# Patient Record
Sex: Male | Born: 1935 | Race: White | Hispanic: No | Smoking: Never smoker
Health system: Southern US, Community
[De-identification: ages and names within clinical notes are randomized; demographics above are authoritative.]

## PROBLEM LIST (undated history)

## (undated) DIAGNOSIS — I219 Acute myocardial infarction, unspecified: Secondary | ICD-10-CM

## (undated) DIAGNOSIS — R079 Chest pain, unspecified: Secondary | ICD-10-CM

## (undated) DIAGNOSIS — C189 Malignant neoplasm of colon, unspecified: Secondary | ICD-10-CM

## (undated) DIAGNOSIS — I129 Hypertensive chronic kidney disease with stage 1 through stage 4 chronic kidney disease, or unspecified chronic kidney disease: Secondary | ICD-10-CM

## (undated) DIAGNOSIS — G309 Alzheimer's disease, unspecified: Secondary | ICD-10-CM

## (undated) DIAGNOSIS — N189 Chronic kidney disease, unspecified: Secondary | ICD-10-CM

## (undated) DIAGNOSIS — D72829 Elevated white blood cell count, unspecified: Secondary | ICD-10-CM

## (undated) DIAGNOSIS — E119 Type 2 diabetes mellitus without complications: Secondary | ICD-10-CM

## (undated) DIAGNOSIS — I4892 Unspecified atrial flutter: Secondary | ICD-10-CM

## (undated) DIAGNOSIS — I214 Non-ST elevation (NSTEMI) myocardial infarction: Secondary | ICD-10-CM

## (undated) DIAGNOSIS — G20 Parkinson's disease: Secondary | ICD-10-CM

## (undated) DIAGNOSIS — I2510 Atherosclerotic heart disease of native coronary artery without angina pectoris: Secondary | ICD-10-CM

## (undated) DIAGNOSIS — I10 Essential (primary) hypertension: Secondary | ICD-10-CM

## (undated) DIAGNOSIS — N183 Chronic kidney disease, stage 3 (moderate): Secondary | ICD-10-CM

## (undated) DIAGNOSIS — F0280 Dementia in other diseases classified elsewhere without behavioral disturbance: Secondary | ICD-10-CM

## (undated) DIAGNOSIS — W19XXXA Unspecified fall, initial encounter: Secondary | ICD-10-CM

## (undated) DIAGNOSIS — I951 Orthostatic hypotension: Secondary | ICD-10-CM

## (undated) DIAGNOSIS — E785 Hyperlipidemia, unspecified: Secondary | ICD-10-CM

## (undated) DIAGNOSIS — I609 Nontraumatic subarachnoid hemorrhage, unspecified: Secondary | ICD-10-CM

## (undated) DIAGNOSIS — R55 Syncope and collapse: Secondary | ICD-10-CM

## (undated) DIAGNOSIS — I200 Unstable angina: Secondary | ICD-10-CM

## (undated) HISTORY — DX: Atherosclerotic heart disease of native coronary artery without angina pectoris: I25.10

## (undated) HISTORY — DX: Elevated white blood cell count, unspecified: D72.829

## (undated) HISTORY — PX: COLONOSCOPY: SHX174

## (undated) HISTORY — PX: COLON RESECTION: SHX5231

## (undated) HISTORY — DX: Chronic kidney disease, stage 3 (moderate): N18.3

## (undated) HISTORY — DX: Alzheimer's disease, unspecified: G30.9

## (undated) HISTORY — DX: Chronic kidney disease, unspecified: N18.9

## (undated) HISTORY — DX: Parkinson's disease: G20

## (undated) HISTORY — PX: CORONARY ANGIOPLASTY WITH STENT PLACEMENT: SHX49

## (undated) HISTORY — DX: Nontraumatic subarachnoid hemorrhage, unspecified: I60.9

## (undated) HISTORY — PX: HAND SURGERY: SHX662

## (undated) HISTORY — DX: Unstable angina: I20.0

## (undated) HISTORY — DX: Type 2 diabetes mellitus without complications: E11.9

## (undated) HISTORY — DX: Malignant neoplasm of colon, unspecified: C18.9

## (undated) HISTORY — DX: Hypertensive chronic kidney disease with stage 1 through stage 4 chronic kidney disease, or unspecified chronic kidney disease: I12.9

## (undated) HISTORY — DX: Dementia in other diseases classified elsewhere without behavioral disturbance: F02.80

## (undated) HISTORY — DX: Orthostatic hypotension: I95.1

## (undated) HISTORY — DX: Unspecified fall, initial encounter: W19.XXXA

## (undated) HISTORY — DX: Unspecified atrial flutter: I48.92

## (undated) HISTORY — DX: Syncope and collapse: R55

## (undated) HISTORY — DX: Non-ST elevation (NSTEMI) myocardial infarction: I21.4

## (undated) HISTORY — DX: Hyperlipidemia, unspecified: E78.5

## (undated) HISTORY — DX: Chest pain, unspecified: R07.9

## (undated) HISTORY — DX: Acute myocardial infarction, unspecified: I21.9

---

## 2014-07-30 DIAGNOSIS — I219 Acute myocardial infarction, unspecified: Secondary | ICD-10-CM

## 2014-07-30 DIAGNOSIS — I214 Non-ST elevation (NSTEMI) myocardial infarction: Secondary | ICD-10-CM | POA: Insufficient documentation

## 2014-07-30 HISTORY — DX: Acute myocardial infarction, unspecified: I21.9

## 2015-04-25 DIAGNOSIS — N183 Chronic kidney disease, stage 3 unspecified: Secondary | ICD-10-CM

## 2015-04-25 DIAGNOSIS — I129 Hypertensive chronic kidney disease with stage 1 through stage 4 chronic kidney disease, or unspecified chronic kidney disease: Secondary | ICD-10-CM | POA: Insufficient documentation

## 2015-04-25 HISTORY — DX: Chronic kidney disease, stage 3 unspecified: N18.30

## 2015-04-25 HISTORY — DX: Hypertensive chronic kidney disease with stage 1 through stage 4 chronic kidney disease, or unspecified chronic kidney disease: I12.9

## 2015-05-29 DIAGNOSIS — R079 Chest pain, unspecified: Secondary | ICD-10-CM

## 2015-05-29 HISTORY — DX: Chest pain, unspecified: R07.9

## 2015-05-31 ENCOUNTER — Encounter (HOSPITAL_COMMUNITY): Payer: Self-pay | Admitting: *Deleted

## 2015-05-31 ENCOUNTER — Emergency Department (HOSPITAL_COMMUNITY): Payer: Medicare Other

## 2015-05-31 ENCOUNTER — Encounter (HOSPITAL_COMMUNITY)
Admission: EM | Disposition: A | Discharge status: Home / Self Care | Payer: Self-pay | Source: Home / Self Care | Attending: Cardiovascular Disease

## 2015-05-31 ENCOUNTER — Inpatient Hospital Stay (HOSPITAL_COMMUNITY)
Admission: EM | Admit: 2015-05-31 | Discharge: 2015-06-01 | DRG: 281 | Disposition: A | Discharge status: Home / Self Care | Payer: Medicare Other | Attending: Cardiovascular Disease | Admitting: Cardiovascular Disease

## 2015-05-31 ENCOUNTER — Ambulatory Visit (HOSPITAL_COMMUNITY): Payer: Medicare Other

## 2015-05-31 DIAGNOSIS — I453 Trifascicular block: Secondary | ICD-10-CM | POA: Diagnosis present

## 2015-05-31 DIAGNOSIS — I10 Essential (primary) hypertension: Secondary | ICD-10-CM

## 2015-05-31 DIAGNOSIS — E1122 Type 2 diabetes mellitus with diabetic chronic kidney disease: Secondary | ICD-10-CM | POA: Diagnosis present

## 2015-05-31 DIAGNOSIS — G20 Parkinson's disease: Secondary | ICD-10-CM

## 2015-05-31 DIAGNOSIS — Z7982 Long term (current) use of aspirin: Secondary | ICD-10-CM

## 2015-05-31 DIAGNOSIS — I2510 Atherosclerotic heart disease of native coronary artery without angina pectoris: Secondary | ICD-10-CM | POA: Diagnosis present

## 2015-05-31 DIAGNOSIS — Z7902 Long term (current) use of antithrombotics/antiplatelets: Secondary | ICD-10-CM | POA: Diagnosis not present

## 2015-05-31 DIAGNOSIS — E785 Hyperlipidemia, unspecified: Secondary | ICD-10-CM

## 2015-05-31 DIAGNOSIS — E876 Hypokalemia: Secondary | ICD-10-CM | POA: Diagnosis not present

## 2015-05-31 DIAGNOSIS — R079 Chest pain, unspecified: Secondary | ICD-10-CM | POA: Diagnosis not present

## 2015-05-31 DIAGNOSIS — I200 Unstable angina: Secondary | ICD-10-CM

## 2015-05-31 DIAGNOSIS — I252 Old myocardial infarction: Secondary | ICD-10-CM | POA: Diagnosis not present

## 2015-05-31 DIAGNOSIS — I129 Hypertensive chronic kidney disease with stage 1 through stage 4 chronic kidney disease, or unspecified chronic kidney disease: Secondary | ICD-10-CM | POA: Diagnosis present

## 2015-05-31 DIAGNOSIS — E118 Type 2 diabetes mellitus with unspecified complications: Secondary | ICD-10-CM

## 2015-05-31 DIAGNOSIS — G20A1 Parkinson's disease without dyskinesia, without mention of fluctuations: Secondary | ICD-10-CM

## 2015-05-31 DIAGNOSIS — N189 Chronic kidney disease, unspecified: Secondary | ICD-10-CM

## 2015-05-31 DIAGNOSIS — E119 Type 2 diabetes mellitus without complications: Secondary | ICD-10-CM

## 2015-05-31 DIAGNOSIS — I214 Non-ST elevation (NSTEMI) myocardial infarction: Principal | ICD-10-CM | POA: Diagnosis present

## 2015-05-31 DIAGNOSIS — I25110 Atherosclerotic heart disease of native coronary artery with unstable angina pectoris: Secondary | ICD-10-CM | POA: Diagnosis present

## 2015-05-31 DIAGNOSIS — Z955 Presence of coronary angioplasty implant and graft: Secondary | ICD-10-CM

## 2015-05-31 HISTORY — PX: CARDIAC CATHETERIZATION: SHX172

## 2015-05-31 HISTORY — DX: Chronic kidney disease, unspecified: N18.9

## 2015-05-31 HISTORY — DX: Essential (primary) hypertension: I10

## 2015-05-31 HISTORY — DX: Type 2 diabetes mellitus without complications: E11.9

## 2015-05-31 HISTORY — DX: Unstable angina: I20.0

## 2015-05-31 HISTORY — DX: Hyperlipidemia, unspecified: E78.5

## 2015-05-31 HISTORY — DX: Parkinson's disease: G20

## 2015-05-31 HISTORY — DX: Non-ST elevation (NSTEMI) myocardial infarction: I21.4

## 2015-05-31 LAB — BASIC METABOLIC PANEL
ANION GAP: 9 (ref 5–15)
BUN: 13 mg/dL (ref 6–20)
CALCIUM: 9.1 mg/dL (ref 8.9–10.3)
CHLORIDE: 105 mmol/L (ref 101–111)
CO2: 27 mmol/L (ref 22–32)
Creatinine, Ser: 1.33 mg/dL — ABNORMAL HIGH (ref 0.61–1.24)
GFR calc non Af Amer: 49 mL/min — ABNORMAL LOW (ref >60)
GFR, EST AFRICAN AMERICAN: 57 mL/min — AB (ref >60)
Glucose, Bld: 112 mg/dL — ABNORMAL HIGH (ref 65–99)
Potassium: 3.5 mmol/L (ref 3.5–5.1)
SODIUM: 141 mmol/L (ref 135–145)

## 2015-05-31 LAB — CBC
HCT: 36.2 % — ABNORMAL LOW (ref 39.0–52.0)
Hemoglobin: 12.6 g/dL — ABNORMAL LOW (ref 13.0–17.0)
MCH: 31.3 pg (ref 26.0–34.0)
MCHC: 34.8 g/dL (ref 30.0–36.0)
MCV: 89.8 fL (ref 78.0–100.0)
PLATELETS: 182 10*3/uL (ref 150–400)
RBC: 4.03 MIL/uL — AB (ref 4.22–5.81)
RDW: 12.9 % (ref 11.5–15.5)
WBC: 8 10*3/uL (ref 4.0–10.5)

## 2015-05-31 LAB — COMPREHENSIVE METABOLIC PANEL
ALK PHOS: 53 U/L (ref 38–126)
ALT: 14 U/L — ABNORMAL LOW (ref 17–63)
ANION GAP: 10 (ref 5–15)
AST: 26 U/L (ref 15–41)
Albumin: 3.7 g/dL (ref 3.5–5.0)
BUN: 14 mg/dL (ref 6–20)
CALCIUM: 9.3 mg/dL (ref 8.9–10.3)
CHLORIDE: 101 mmol/L (ref 101–111)
CO2: 27 mmol/L (ref 22–32)
Creatinine, Ser: 1.42 mg/dL — ABNORMAL HIGH (ref 0.61–1.24)
GFR calc non Af Amer: 45 mL/min — ABNORMAL LOW (ref >60)
GFR, EST AFRICAN AMERICAN: 53 mL/min — AB (ref >60)
Glucose, Bld: 122 mg/dL — ABNORMAL HIGH (ref 65–99)
Potassium: 3.5 mmol/L (ref 3.5–5.1)
SODIUM: 138 mmol/L (ref 135–145)
Total Bilirubin: 0.4 mg/dL (ref 0.3–1.2)
Total Protein: 6.6 g/dL (ref 6.5–8.1)

## 2015-05-31 LAB — CBC WITH DIFFERENTIAL/PLATELET
BASOS PCT: 1 %
Basophils Absolute: 0.1 10*3/uL (ref 0.0–0.1)
Eosinophils Absolute: 0.4 10*3/uL (ref 0.0–0.7)
Eosinophils Relative: 4 %
HEMATOCRIT: 37.6 % — AB (ref 39.0–52.0)
HEMOGLOBIN: 12.4 g/dL — AB (ref 13.0–17.0)
Lymphocytes Relative: 27 %
Lymphs Abs: 2.6 10*3/uL (ref 0.7–4.0)
MCH: 29.9 pg (ref 26.0–34.0)
MCHC: 33 g/dL (ref 30.0–36.0)
MCV: 90.6 fL (ref 78.0–100.0)
MONOS PCT: 11 %
Monocytes Absolute: 1 10*3/uL (ref 0.1–1.0)
NEUTROS ABS: 5.6 10*3/uL (ref 1.7–7.7)
NEUTROS PCT: 57 %
Platelets: 200 10*3/uL (ref 150–400)
RBC: 4.15 MIL/uL — AB (ref 4.22–5.81)
RDW: 12.9 % (ref 11.5–15.5)
WBC: 9.7 10*3/uL (ref 4.0–10.5)

## 2015-05-31 LAB — LIPID PANEL
CHOLESTEROL: 198 mg/dL (ref 0–200)
HDL: 38 mg/dL — AB (ref >40)
LDL Cholesterol: 133 mg/dL — ABNORMAL HIGH (ref 0–99)
TRIGLYCERIDES: 136 mg/dL (ref <150)
Total CHOL/HDL Ratio: 5.2 RATIO
VLDL: 27 mg/dL (ref 0–40)

## 2015-05-31 LAB — GLUCOSE, CAPILLARY
GLUCOSE-CAPILLARY: 146 mg/dL — AB (ref 65–99)
Glucose-Capillary: 105 mg/dL — ABNORMAL HIGH (ref 65–99)
Glucose-Capillary: 108 mg/dL — ABNORMAL HIGH (ref 65–99)
Glucose-Capillary: 96 mg/dL (ref 65–99)

## 2015-05-31 LAB — MRSA PCR SCREENING: MRSA by PCR: NEGATIVE

## 2015-05-31 LAB — CREATININE, SERUM
CREATININE: 1.34 mg/dL — AB (ref 0.61–1.24)
GFR, EST AFRICAN AMERICAN: 56 mL/min — AB (ref >60)
GFR, EST NON AFRICAN AMERICAN: 49 mL/min — AB (ref >60)

## 2015-05-31 LAB — BRAIN NATRIURETIC PEPTIDE: B Natriuretic Peptide: 142 pg/mL — ABNORMAL HIGH (ref 0.0–100.0)

## 2015-05-31 LAB — PROTIME-INR
INR: 1.02 (ref 0.00–1.49)
Prothrombin Time: 13.6 seconds (ref 11.6–15.2)

## 2015-05-31 LAB — TROPONIN I
TROPONIN I: 0.03 ng/mL (ref <0.031)
TROPONIN I: 0.19 ng/mL — AB (ref <0.031)
TROPONIN I: 0.67 ng/mL — AB (ref <0.031)

## 2015-05-31 SURGERY — LEFT HEART CATH AND CORONARY ANGIOGRAPHY

## 2015-05-31 MED ORDER — NITROGLYCERIN IN D5W 200-5 MCG/ML-% IV SOLN
5.0000 ug/min | INTRAVENOUS | Status: DC
  Administered 2015-05-31: 5 ug/min via INTRAVENOUS

## 2015-05-31 MED ORDER — LIDOCAINE HCL (PF) 1 % IJ SOLN
INTRAMUSCULAR | Status: AC
  Filled 2015-05-31: qty 30

## 2015-05-31 MED ORDER — MORPHINE SULFATE (PF) 2 MG/ML IV SOLN: 1.0000 mg | Freq: Once | INTRAVENOUS | Status: DC

## 2015-05-31 MED ORDER — IOHEXOL 350 MG/ML SOLN
INTRAVENOUS | Status: DC | PRN
  Administered 2015-05-31: 75 mL via INTRA_ARTERIAL

## 2015-05-31 MED ORDER — SODIUM CHLORIDE 0.9 % IV SOLN: 250.0000 mL | INTRAVENOUS | Status: DC | PRN

## 2015-05-31 MED ORDER — ADULT MULTIVITAMIN W/MINERALS CH
1.0000 | ORAL_TABLET | Freq: Every day | ORAL | Status: DC
  Administered 2015-06-01: 1 via ORAL
  Filled 2015-05-31 (×2): qty 1

## 2015-05-31 MED ORDER — SODIUM CHLORIDE 0.9 % WEIGHT BASED INFUSION: 3.0000 mL/kg/h | INTRAVENOUS | Status: DC

## 2015-05-31 MED ORDER — HEPARIN SODIUM (PORCINE) 1000 UNIT/ML IJ SOLN
INTRAMUSCULAR | Status: DC | PRN
  Administered 2015-05-31: 4500 [IU] via INTRAVENOUS

## 2015-05-31 MED ORDER — LINAGLIPTIN 5 MG PO TABS
5.0000 mg | ORAL_TABLET | Freq: Every day | ORAL | Status: DC
Start: 2015-05-31 — End: 2015-06-01
  Administered 2015-06-01: 5 mg via ORAL
  Filled 2015-05-31 (×2): qty 1

## 2015-05-31 MED ORDER — MIDAZOLAM HCL 2 MG/2ML IJ SOLN
INTRAMUSCULAR | Status: AC
  Filled 2015-05-31: qty 4

## 2015-05-31 MED ORDER — SODIUM CHLORIDE 0.9 % IJ SOLN
3.0000 mL | Freq: Two times a day (BID) | INTRAMUSCULAR | Status: DC
Start: 2015-05-31 — End: 2015-06-01
  Administered 2015-06-01: 3 mL via INTRAVENOUS

## 2015-05-31 MED ORDER — ASPIRIN 81 MG PO CHEW
324.0000 mg | CHEWABLE_TABLET | Freq: Once | ORAL | Status: AC
  Administered 2015-05-31: 324 mg via ORAL
  Filled 2015-05-31: qty 4

## 2015-05-31 MED ORDER — VITAMIN D 1000 UNITS PO TABS
1000.0000 [IU] | ORAL_TABLET | Freq: Every day | ORAL | Status: DC
  Administered 2015-06-01: 1000 [IU] via ORAL
  Filled 2015-05-31 (×2): qty 1

## 2015-05-31 MED ORDER — LIDOCAINE HCL (PF) 1 % IJ SOLN
INTRAMUSCULAR | Status: DC | PRN
  Administered 2015-05-31: 5 mL

## 2015-05-31 MED ORDER — LINACLOTIDE 290 MCG PO CAPS
290.0000 ug | ORAL_CAPSULE | Freq: Every day | ORAL | Status: DC
  Administered 2015-06-01: 290 ug via ORAL
  Filled 2015-05-31 (×2): qty 1

## 2015-05-31 MED ORDER — VERAPAMIL HCL 2.5 MG/ML IV SOLN
INTRAVENOUS | Status: AC
  Filled 2015-05-31: qty 2

## 2015-05-31 MED ORDER — ASPIRIN EC 81 MG PO TBEC
81.0000 mg | DELAYED_RELEASE_TABLET | Freq: Every day | ORAL | Status: DC
  Administered 2015-06-01: 81 mg via ORAL
  Filled 2015-05-31: qty 1

## 2015-05-31 MED ORDER — ASPIRIN EC 81 MG PO TBEC: 81.0000 mg | DELAYED_RELEASE_TABLET | Freq: Every day | ORAL | Status: DC

## 2015-05-31 MED ORDER — HEPARIN SODIUM (PORCINE) 1000 UNIT/ML IJ SOLN
INTRAMUSCULAR | Status: AC
  Filled 2015-05-31: qty 1

## 2015-05-31 MED ORDER — CARBIDOPA-LEVODOPA 25-250 MG PO TABS
1.0000 | ORAL_TABLET | Freq: Two times a day (BID) | ORAL | Status: DC
  Administered 2015-05-31 – 2015-06-01 (×3): 1 via ORAL
  Filled 2015-05-31 (×5): qty 1

## 2015-05-31 MED ORDER — MIDAZOLAM HCL 2 MG/2ML IJ SOLN
INTRAMUSCULAR | Status: DC | PRN
  Administered 2015-05-31: 1 mg via INTRAVENOUS

## 2015-05-31 MED ORDER — HEPARIN (PORCINE) IN NACL 2-0.9 UNIT/ML-% IJ SOLN
INTRAMUSCULAR | Status: DC | PRN
  Administered 2015-05-31: 13:00:00

## 2015-05-31 MED ORDER — HEPARIN SODIUM (PORCINE) 5000 UNIT/ML IJ SOLN
5000.0000 [IU] | Freq: Three times a day (TID) | INTRAMUSCULAR | Status: DC
  Administered 2015-05-31: 5000 [IU] via SUBCUTANEOUS
  Filled 2015-05-31: qty 1

## 2015-05-31 MED ORDER — NITROGLYCERIN 0.4 MG SL SUBL: 0.4000 mg | SUBLINGUAL_TABLET | SUBLINGUAL | Status: DC | PRN

## 2015-05-31 MED ORDER — ONDANSETRON HCL 4 MG/2ML IJ SOLN: 4.0000 mg | Freq: Four times a day (QID) | INTRAMUSCULAR | Status: DC | PRN

## 2015-05-31 MED ORDER — PANTOPRAZOLE SODIUM 40 MG PO TBEC
40.0000 mg | DELAYED_RELEASE_TABLET | Freq: Every day | ORAL | Status: DC
  Administered 2015-06-01: 40 mg via ORAL
  Filled 2015-05-31: qty 1

## 2015-05-31 MED ORDER — CLOPIDOGREL BISULFATE 75 MG PO TABS
75.0000 mg | ORAL_TABLET | Freq: Every day | ORAL | Status: DC
  Administered 2015-05-31 – 2015-06-01 (×2): 75 mg via ORAL
  Filled 2015-05-31 (×2): qty 1

## 2015-05-31 MED ORDER — INSULIN ASPART 100 UNIT/ML ~~LOC~~ SOLN
0.0000 [IU] | Freq: Three times a day (TID) | SUBCUTANEOUS | Status: DC
  Administered 2015-05-31: 2 [IU] via SUBCUTANEOUS

## 2015-05-31 MED ORDER — RAMIPRIL 10 MG PO CAPS
10.0000 mg | ORAL_CAPSULE | Freq: Two times a day (BID) | ORAL | Status: DC
  Administered 2015-05-31 – 2015-06-01 (×2): 10 mg via ORAL
  Filled 2015-05-31 (×4): qty 1

## 2015-05-31 MED ORDER — VERAPAMIL HCL 2.5 MG/ML IV SOLN
INTRAVENOUS | Status: DC | PRN
  Administered 2015-05-31: 10 mL via INTRA_ARTERIAL

## 2015-05-31 MED ORDER — SODIUM CHLORIDE 0.9 % IV SOLN
INTRAVENOUS | Status: DC
  Administered 2015-05-31: 07:00:00 via INTRAVENOUS

## 2015-05-31 MED ORDER — ENTACAPONE 200 MG PO TABS
200.0000 mg | ORAL_TABLET | Freq: Two times a day (BID) | ORAL | Status: DC
  Administered 2015-05-31 – 2015-06-01 (×2): 200 mg via ORAL
  Filled 2015-05-31 (×4): qty 1

## 2015-05-31 MED ORDER — HEPARIN (PORCINE) IN NACL 2-0.9 UNIT/ML-% IJ SOLN
INTRAMUSCULAR | Status: AC
  Filled 2015-05-31: qty 1500

## 2015-05-31 MED ORDER — ASPIRIN 81 MG PO CHEW
324.0000 mg | CHEWABLE_TABLET | ORAL | Status: DC
Start: 2015-05-31 — End: 2015-05-31

## 2015-05-31 MED ORDER — DONEPEZIL HCL 10 MG PO TABS
10.0000 mg | ORAL_TABLET | Freq: Every day | ORAL | Status: DC
  Administered 2015-05-31: 10 mg via ORAL
  Filled 2015-05-31 (×3): qty 1

## 2015-05-31 MED ORDER — SODIUM CHLORIDE 0.9 % IV SOLN
INTRAVENOUS | Status: AC
  Administered 2015-05-31: 15:00:00 via INTRAVENOUS

## 2015-05-31 MED ORDER — ACETAMINOPHEN 325 MG PO TABS: 650.0000 mg | ORAL_TABLET | ORAL | Status: DC | PRN

## 2015-05-31 MED ORDER — SODIUM CHLORIDE 0.9 % IJ SOLN: 3.0000 mL | Freq: Two times a day (BID) | INTRAMUSCULAR | Status: DC

## 2015-05-31 MED ORDER — SODIUM CHLORIDE 0.9 % IJ SOLN: 3.0000 mL | INTRAMUSCULAR | Status: DC | PRN

## 2015-05-31 MED ORDER — NITROGLYCERIN IN D5W 200-5 MCG/ML-% IV SOLN
INTRAVENOUS | Status: AC
  Filled 2015-05-31: qty 250

## 2015-05-31 MED ORDER — METOPROLOL SUCCINATE ER 100 MG PO TB24
100.0000 mg | ORAL_TABLET | Freq: Every day | ORAL | Status: DC
  Administered 2015-06-01: 100 mg via ORAL
  Filled 2015-05-31 (×2): qty 1

## 2015-05-31 MED ORDER — ASPIRIN EC 81 MG PO TBEC
81.0000 mg | DELAYED_RELEASE_TABLET | Freq: Every day | ORAL | Status: DC
  Filled 2015-05-31: qty 1

## 2015-05-31 MED ORDER — SODIUM CHLORIDE 0.9 % WEIGHT BASED INFUSION: 1.0000 mL/kg/h | INTRAVENOUS | Status: DC

## 2015-05-31 MED ORDER — ASPIRIN 300 MG RE SUPP
300.0000 mg | RECTAL | Status: DC
Start: 2015-05-31 — End: 2015-05-31

## 2015-05-31 MED ORDER — ASPIRIN 81 MG PO CHEW: 81.0000 mg | CHEWABLE_TABLET | ORAL | Status: DC

## 2015-05-31 SURGICAL SUPPLY — 11 items
CATH INFINITI 5 FR JL3.5 (CATHETERS) ×2 IMPLANT
CATH INFINITI 5FR ANG PIGTAIL (CATHETERS) ×2 IMPLANT
CATH INFINITI JR4 5F (CATHETERS) ×2 IMPLANT
CATH VISTA GUIDE 6FR XBLAD3.5 (CATHETERS) ×2 IMPLANT
DEVICE RAD COMP TR BAND LRG (VASCULAR PRODUCTS) ×2 IMPLANT
GLIDESHEATH SLEND SS 6F .021 (SHEATH) ×2 IMPLANT
KIT HEART LEFT (KITS) ×2 IMPLANT
PACK CARDIAC CATHETERIZATION (CUSTOM PROCEDURE TRAY) ×2 IMPLANT
TRANSDUCER W/STOPCOCK (MISCELLANEOUS) ×2 IMPLANT
TUBING CIL FLEX 10 FLL-RA (TUBING) ×2 IMPLANT
WIRE SAFE-T 1.5MM-J .035X260CM (WIRE) ×2 IMPLANT

## 2015-05-31 NOTE — ED Notes (Signed)
Waiting on fax from [facility]

## 2015-05-31 NOTE — ED Notes (Signed)
Cardiologist in room with pt 

## 2015-05-31 NOTE — Progress Notes (Signed)
Sleeping. Family at bedside.

## 2015-05-31 NOTE — ED Notes (Signed)
Pt states that his pain is starting to come back, 5/10 at this time.

## 2015-05-31 NOTE — ED Provider Notes (Signed)
CSN: [id number]     Arrival date & time 05/31/15  0127 History   First MD Initiated Contact with Patient 05/31/15 0128     Chief Complaint  Patient presents with  . Chest Pain     (Consider location/radiation/quality/duration/timing/severity/associated sxs/prior Treatment) Patient is a 81 y.o. male presenting with chest pain. The history is provided by the patient. No language interpreter was used.  Chest Pain Pain location:  L chest Pain quality: sharp   Pain radiates to:  Neck Pain severity:  Moderate Associated symptoms: diaphoresis and nausea   Associated symptoms: no abdominal pain, no fever, no headache, no shortness of breath, not vomiting and no weakness   Associated symptoms comment:  Patient started having left sided chest pain 3-4 days ago. It had been intermittent and is now more constant. He denies SOB but reports nausea with diaphoresis. He has a cardiac history including MI and stent placement, last 2 years ago. His doctors have been in [place] and [facility].  He was seen and evaluated at [facility] and released yesterday. He states he has nitro at home and it had been providing relief with less efficacy. Nitro x 6 prior to arrival with partial relief.    Past Medical History  Diagnosis Date  . Diabetes mellitus without complication ([place])   . Coronary artery disease   . Hypertension   . Parkinson's disease [facility])    Past Surgical History  Procedure Laterality Date  . Coronary angioplasty with stent placement     History reviewed. No pertinent family history. Social History  Substance Use Topics  . Smoking status: Never Smoker   . Smokeless tobacco: None  . Alcohol Use: No    Review of Systems  Constitutional: Positive for diaphoresis. Negative for fever.  Respiratory: Negative for shortness of breath.   Cardiovascular: Positive for chest pain.  Gastrointestinal: Positive for nausea. Negative for vomiting and abdominal pain.   Neurological: Negative for syncope, weakness and headaches.      Allergies  Review of patient's allergies indicates no known allergies.  Home Medications   Prior to Admission medications   Medication Sig Start Date End Date Taking? Authorizing Provider  aspirin EC 81 MG tablet Take 81 mg by mouth daily.   Yes Historical Provider, MD  carbidopa-levodopa (SINEMET IR) 25-250 MG tablet Take 1 tablet by mouth 2 (two) times daily.   Yes Historical Provider, MD  cholecalciferol (VITAMIN D) 1000 UNITS tablet Take 1,000 Units by mouth daily.   Yes Historical Provider, MD  clopidogrel (PLAVIX) 75 MG tablet Take 75 mg by mouth daily.   Yes Historical Provider, MD  donepezil (ARICEPT) 10 MG tablet Take 10 mg by mouth at bedtime.   Yes Historical Provider, MD  entacapone (COMTAN) 200 MG tablet Take 200 mg by mouth 2 (two) times daily.   Yes Historical Provider, MD  Linaclotide (LINZESS) 290 MCG CAPS capsule Take 290 mcg by mouth daily.   Yes Historical Provider, MD  metoprolol succinate (TOPROL-XL) 100 MG 24 hr tablet Take 100 mg by mouth daily. Take with or immediately following a meal.   Yes Historical Provider, MD  Multiple Vitamin (MULTIVITAMIN WITH MINERALS) TABS tablet Take 1 tablet by mouth daily.   Yes Historical Provider, MD  nitroGLYCERIN (NITROSTAT) 0.4 MG SL tablet Place 0.4 mg under the tongue every 5 (five) minutes as needed for chest pain.   Yes Historical Provider, MD  pantoprazole (PROTONIX) 40 MG tablet Take 40 mg by mouth daily.  Yes Historical Provider, MD  ramipril (ALTACE) 10 MG capsule Take 10 mg by mouth 2 (two) times daily.   Yes Historical Provider, MD  saxagliptin HCl (ONGLYZA) 5 MG TABS tablet Take 5 mg by mouth daily.   Yes Historical Provider, MD   BP 135/92 mmHg  Pulse 69  Temp(Src) 98.1 F (36.7 C) (Oral)  Resp 19  SpO2 98% Physical Exam  Constitutional: He is oriented to person, place, and time. He appears well-developed and well-nourished.  HENT:  Head:  Normocephalic.  Neck: Normal range of motion. Neck supple.  Cardiovascular: Normal rate and regular rhythm.   No murmur heard. Pulmonary/Chest: Effort normal and breath sounds normal. He has no wheezes. He has no rales. He exhibits no tenderness.  Abdominal: Soft. Bowel sounds are normal. There is no tenderness. There is no rebound and no guarding.  Musculoskeletal: Normal range of motion. He exhibits no edema.  Neurological: He is alert and oriented to person, place, and time.  Skin: Skin is warm and dry. No rash noted.  Psychiatric: He has a normal mood and affect.    ED Course  Procedures (including critical care time) Labs Review Labs Reviewed  COMPREHENSIVE METABOLIC PANEL - Abnormal; Notable for the following:    Glucose, Bld 122 (*)    Creatinine, Ser 1.42 (*)    ALT 14 (*)    GFR calc non Af Amer 45 (*)    GFR calc Af Amer 53 (*)    All other components within normal limits  CBC WITH DIFFERENTIAL/PLATELET - Abnormal; Notable for the following:    RBC 4.15 (*)    Hemoglobin 12.4 (*)    HCT 37.6 (*)    All other components within normal limits  BRAIN NATRIURETIC PEPTIDE - Abnormal; Notable for the following:    B Natriuretic Peptide 142.0 (*)    All other components within normal limits  TROPONIN I    Imaging Review Dg Chest Port 1 View  05/31/2015  CLINICAL DATA:  Chest pain and shortness of Breath EXAM: PORTABLE CHEST - 1 VIEW COMPARISON:  Chest x-ray, chest CT and nuclear cardiac exam from previous day at [facility] regional FINDINGS: Cardiac shadow is within normal limits. The lungs are clear bilaterally. No acute bony abnormality is noted. IMPRESSION: No acute abnormality noted. Should be noted the patient had negative CTA of the chest yesterday at [facility]. A negative nuclear cardiac exam was performed as well. Electronically Signed   By: Inez Catalina M.D.   On: 05/31/2015 02:17   I have personally reviewed and evaluated these images and lab  results as part of my medical decision-making.   EKG Interpretation   Date/Time:  Tuesday May 31 2015 01:32:51 EDT Ventricular Rate:  82 PR Interval:  269 QRS Duration: 162 QT Interval:  448 QTC Calculation: 523 R Axis:   -61 Text Interpretation:  Sinus or ectopic atrial rhythm Prolonged PR interval  RBBB and LAFB Probable lateral infarct, old No old tracing to compare  Confirmed by Bess,  DO, KRISTEN ([id number]) on 05/31/2015 2:03:28 AM      MDM   Final diagnoses:  Unstable angina ([place])    Patient is currently having 2/10 pain. Concern for unstable angina as patient reports pain is similar to previous MI, now difficult to control with home nitro. Normal troponin, stable labs. Discussed with cardiology who will see and admit the patient.     Charlann Lange, PA-C 05/31/15 [place], DO 05/31/15  0430 

## 2015-05-31 NOTE — H&P (Signed)
Reason for Admission  Unstable angina  Cardiologist: [name] [facility] Alert)  HPI: This is a 81 y.o. male with a past medical history significant for long-standing coronary artery disease and multiple previous revascularization procedures, presenting today with accelerating angina and angina at rest. He was just discharged from [facility] yesterday afternoon.  His first myocardial infarction occurred roughly 14 years ago. Most recently he received a stent about 2 years ago. His cardiologist is in [place] and all his coronary procedures have been performed at [facility]. Unfortunately, there is no information that can be retrieved from "CareEverywhere" and we have been unable to find anyone in medical records at [facility]. From the patient we understand that a CT scan was performed to rule out pulmonary embolism and then a nuclear stress test was performed with normal results. This is corroborated by a mention of these tests in the report from the radiologist that read his chest x-ray tonight. Kenneth Macias does not recall the name of the interventional cardiologist who performed his procedure and does not know the type of stent that he received.  He describes the chest discomfort that he is currently experiencing as identical to what he felt with his previous coronary events. It began about 2 weeks ago initially with exertion. It accelerated rapidly occurring with lower and lower thresholds of activity and is now present at rest. It was promptly relieved by intravenous nitroglycerin administered here in the emergency room.  His electrocardiogram is abnormal due to the presence of right bundle branch block and left anterior fascicular block, but no frank ischemic repolarization amenities are seen. We do not have an old EKG for comparison. Point-of-care cardiac enzymes are normal. His creatinine is 1.42 and he reports problems with chronic kidney disease. He sees a nephrologist with  [facility]. He does not know his baseline creatinine. He has type 2 diabetes mellitus controlled with a relatively simple regimen of oral antidiabetics.  He denies dyspnea, syncope, focal neurological events of recent onset, intermittent claudication, lower showed edema, bleeding. He has been compliant with his medications including aspirin and clopidogrel. He reports a history of Parkinson's disease and dementia but appears to be fully alert and oriented today. He does not have a history of stroke or congestive heart failure. To his knowledge she has never had problems with arrhythmia or valvular heart disease.  PMHx:  Past Medical History  Diagnosis Date  . Diabetes mellitus without complication ([place])   . Coronary artery disease   . Hypertension   . Parkinson's disease [facility])    Past Surgical History  Procedure Laterality Date  . Coronary angioplasty with stent placement      FAMHx: History reviewed. No pertinent family history.  SOCHx:  reports that he has never smoked. He does not have any smokeless tobacco history on file. He reports that he does not drink alcohol or use illicit drugs.  ALLERGIES: No Known Allergies  ROS: Pertinent items noted in HPI and remainder of comprehensive ROS otherwise negative.  HOME MEDICATIONS:  Ordered as: aspirin EC tablet 81 mg - 81 mg, Oral, Daily, First dose on Tue 05/31/15 at 1000  Ordered as: carbidopa-levodopa (SINEMET IR) 25-250 MG per tablet immediate release 1 tablet - 1 tablet, Oral, 2 times daily, First dose on Tue 05/31/15 at [place] as: cholecalciferol (VITAMIN D) tablet 1,000 Units - 1,000 Units, Oral, Daily, First dose on Tue 05/31/15 at 1000  Ordered as: clopidogrel (PLAVIX) tablet 75 mg - 75  mg, Oral, Daily, First dose on Tue 05/31/15 at 1000  Ordered as: donepezil (ARICEPT) tablet 10 mg - 10 mg, Oral, Daily at bedtime, First dose on Tue 05/31/15 at 2200  Ordered as: entacapone (COMTAN) tablet 200 mg - 200 mg, Oral, 2 times  daily, First dose on Tue 05/31/15 at [place] as: Linaclotide (LINZESS) capsule 290 mcg - 290 mcg, Oral, Daily, First dose on Tue 05/31/15 at 1000  Ordered as: metoprolol succinate (TOPROL-XL) 24 hr tablet 100 mg - 100 mg, Oral, Daily, First dose on Tue 05/31/15 at 1000  Ordered as: multivitamin with minerals tablet 1 tablet - 1 tablet, Oral, Daily, First dose on Tue 05/31/15 at 1000  Ordered as: nitroGLYCERIN (NITROSTAT) SL tablet 0.4 mg - 0.4 mg, Sublingual, Every 5 min PRN, chest pain, Starting Tue 05/31/15 at 0342  Ordered as: pantoprazole (PROTONIX) EC tablet 40 mg - 40 mg, Oral, Daily, First dose on Tue 05/31/15 at 1000  Ordered as: ramipril (ALTACE) capsule 10 mg - 10 mg, Oral, 2 times daily, First dose on Tue 05/31/15 at [facility] as: linagliptin (TRADJENTA) tablet 5 mg - 5 mg, Oral, Daily, First dose on Tue 05/31/15 at 1000      VITALS: Blood pressure 128/73, pulse 64, temperature 98.1 F (36.7 C), temperature source Oral, resp. rate 16, SpO2 94 %.  PHYSICAL EXAM:  General: Alert, oriented x3, no distress Head: no evidence of trauma, PERRL, EOMI, no exophtalmos or lid lag, no myxedema, no xanthelasma; normal ears, nose and oropharynx, edentulous Neck: Normal jugular venous pulsations and no hepatojugular reflux; brisk carotid pulses without delay and no carotid bruits Chest: clear to auscultation, no signs of consolidation by percussion or palpation, normal fremitus, symmetrical and full respiratory excursions Cardiovascular: normal position and quality of the apical impulse, regular rhythm, normal first heart sound and widely split second heart sound, no rubs or gallops, 1/6 early peaking aortic ejection murmur murmur Abdomen: no tenderness or distention, no masses by palpation, no abnormal pulsatility or arterial bruits, normal bowel sounds, no hepatosplenomegaly Extremities: no clubbing, cyanosis;  no edema; 2+ radial, ulnar and brachial pulses bilaterally; 2+ right femoral,  posterior tibial and dorsalis pedis pulses; 2+ left femoral, posterior tibial and dorsalis pedis pulses; no subclavian or femoral bruits Neurological: grossly nonfocal   LABS  CBC  Recent Labs  05/31/15 0159  WBC 9.7  NEUTROABS 5.6  HGB 12.4*  HCT 37.6*  MCV 90.6  PLT 132   Basic Metabolic Panel  Recent Labs  05/31/15 0159  NA 138  K 3.5  CL 101  CO2 27  GLUCOSE 122*  BUN 14  CREATININE 1.42*  CALCIUM 9.3   Liver Function Tests  Recent Labs  05/31/15 0159  AST 26  ALT 14*  ALKPHOS 53  BILITOT 0.4  PROT 6.6  ALBUMIN 3.7   No results for input(s): LIPASE, AMYLASE in the last 72 hours. Cardiac Enzymes  Recent Labs  05/31/15 0159  TROPONINI 0.03     IMAGING: Dg Chest Port 1 View  05/31/2015  CLINICAL DATA:  Chest pain and shortness of Breath EXAM: PORTABLE CHEST - 1 VIEW COMPARISON:  Chest x-ray, chest CT and nuclear cardiac exam from previous day at [facility] regional FINDINGS: Cardiac shadow is within normal limits. The lungs are clear bilaterally. No acute bony abnormality is noted. IMPRESSION: No acute abnormality noted. Should be noted the patient had negative CTA of the chest yesterday at [facility]. A negative nuclear cardiac exam  was performed as well. Electronically Signed   By: Inez Catalina M.D.   On: 05/31/2015 02:17    ECG: Sinus rhythm, long first-degree AV block (approximately 270 ms), right bundle branch block, left anterior fascicular block, cannot exclude old lateral infarction. No acute repolarization abnormalities seen  TELEMETRY:  sinus rhythm  IMPRESSION: 1. Unstable angina pectoris, but without high risk ECG or biochemical markers 2. Coronary artery disease with reported history of prior myocardial infarction 3. 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 Type 2 diabetes mellitus 5. Essential hypertension 6. Parkinson's disease  RECOMMENDATION: 1. Admit to stepdown, continue  intravenous nitroglycerin. No IV heparin for now. Continue aspirin and clopidogrel 2. Coronary angiography and if necessary repeat percutaneous revascularization. He states: "I don't know why they didn't do that ". This procedure has been fully reviewed with the patient and informed consent has been obtained. Before performing coronary angiography it would be important to find out what his baseline renal function is and also to repeat labs to make sure there is no evidence of contrast-induced nephrotoxicity related to his recent CT angiography performed in [facility]. We are still trying to retrieve old records. 3. Continue beta blocker, monitor for AV block 4. Continue statin, check lipid profile 5. Check hemoglobin A1c   Time Spent Directly with Patient: 60 minutes  Sanda Klein, MD, [facility] [phone] office [phone] pager   05/31/2015, 4:10 AM

## 2015-05-31 NOTE — Interval H&P Note (Signed)
History and Physical Interval Note:  05/31/2015 12:16 PM  Kenneth Macias  has presented today for cardiac cath with the diagnosis of unstable angina. The various methods of treatment have been discussed with the patient and family. After consideration of risks, benefits and other options for treatment, the patient has consented to  Procedure(s): Left Heart Cath and Coronary Angiography (N/A) as a surgical intervention .  The patient's history has been reviewed, patient examined, no change in status, stable for surgery.  I have reviewed the patient's chart and labs.  Questions were answered to the patient's satisfaction.    Cath Lab Visit (complete for each Cath Lab visit)  Clinical Evaluation Leading to the Procedure:   ACS: No.  Non-ACS:    Anginal Classification: CCS III  Anti-ischemic medical therapy: Minimal Therapy (1 class of medications)  Non-Invasive Test Results: No non-invasive testing performed  Prior CABG: No previous CABG         Kenneth Macias

## 2015-05-31 NOTE — ED Notes (Signed)
Pt. Was seen at [facility] for the same earlier today. Pt. Was discharged from [facility] ed and told to come back if pain got worse. Pt. Stated pain 8/10 took 3 nitro brought it to a 6/10. EMS also gave 3 nitro and brought pain down to a 4/10. Pt. Took 325 of ASA. Pt has a hx of 3 previous MIs

## 2015-05-31 NOTE — ED Notes (Signed)
Cath lab nurse came to see the patient and stated that the pt would go to cath later this morning possibly around 1000

## 2015-06-01 ENCOUNTER — Ambulatory Visit (HOSPITAL_COMMUNITY): Payer: Medicare Other

## 2015-06-01 DIAGNOSIS — I214 Non-ST elevation (NSTEMI) myocardial infarction: Principal | ICD-10-CM

## 2015-06-01 DIAGNOSIS — R079 Chest pain, unspecified: Secondary | ICD-10-CM

## 2015-06-01 LAB — CBC
HCT: 35.3 % — ABNORMAL LOW (ref 39.0–52.0)
Hemoglobin: 12 g/dL — ABNORMAL LOW (ref 13.0–17.0)
MCH: 30.5 pg (ref 26.0–34.0)
MCHC: 34 g/dL (ref 30.0–36.0)
MCV: 89.6 fL (ref 78.0–100.0)
PLATELETS: 189 10*3/uL (ref 150–400)
RBC: 3.94 MIL/uL — AB (ref 4.22–5.81)
RDW: 12.8 % (ref 11.5–15.5)
WBC: 12.6 10*3/uL — ABNORMAL HIGH (ref 4.0–10.5)

## 2015-06-01 LAB — BASIC METABOLIC PANEL
Anion gap: 17 — ABNORMAL HIGH (ref 5–15)
BUN: 11 mg/dL (ref 6–20)
CALCIUM: 8.9 mg/dL (ref 8.9–10.3)
CO2: 24 mmol/L (ref 22–32)
CREATININE: 1.19 mg/dL (ref 0.61–1.24)
Chloride: 98 mmol/L — ABNORMAL LOW (ref 101–111)
GFR, EST NON AFRICAN AMERICAN: 56 mL/min — AB (ref >60)
GLUCOSE: 111 mg/dL — AB (ref 65–99)
Potassium: 3.2 mmol/L — ABNORMAL LOW (ref 3.5–5.1)
Sodium: 139 mmol/L (ref 135–145)

## 2015-06-01 LAB — HEMOGLOBIN A1C
Hgb A1c MFr Bld: 6.3 % — ABNORMAL HIGH (ref 4.8–5.6)
Mean Plasma Glucose: 134 mg/dL

## 2015-06-01 LAB — GLUCOSE, CAPILLARY
GLUCOSE-CAPILLARY: 116 mg/dL — AB (ref 65–99)
Glucose-Capillary: 107 mg/dL — ABNORMAL HIGH (ref 65–99)

## 2015-06-01 MED ORDER — ISOSORBIDE MONONITRATE ER 30 MG PO TB24
30.0000 mg | ORAL_TABLET | Freq: Every day | ORAL | Status: DC
  Administered 2015-06-01: 30 mg via ORAL
  Filled 2015-06-01: qty 1

## 2015-06-01 MED ORDER — POTASSIUM CHLORIDE CRYS ER 20 MEQ PO TBCR
40.0000 meq | EXTENDED_RELEASE_TABLET | Freq: Once | ORAL | Status: AC
  Administered 2015-06-01: 40 meq via ORAL
  Filled 2015-06-01: qty 2

## 2015-06-01 MED ORDER — ATORVASTATIN CALCIUM 80 MG PO TABS: 80.0000 mg | ORAL_TABLET | Freq: Every day | ORAL | Status: DC

## 2015-06-01 MED ORDER — ISOSORBIDE MONONITRATE ER 30 MG PO TB24: 30.0000 mg | ORAL_TABLET | Freq: Every day | ORAL | Status: DC

## 2015-06-01 NOTE — Progress Notes (Signed)
Pt is very confused. Pt continues to pull on lines and tubes. Pt kept trying to get OOB without calling for help as a result he pulled the deflation tip off of his TR Band. Band had been on for more than recommended time. When Band was removed there was no bleeding from site. Pressure dressing applied. Will cont to monitor.

## 2015-06-01 NOTE — Discharge Summary (Signed)
Discharge Summary   Patient ID: Kenneth Macias,  MRN: [id number], DOB/AGE: Jan 06, 1934 81 y.o.  Admit date: 05/31/2015 Discharge date: 06/01/2015  Primary Care Provider: Charletta Cousin Primary Cardiologist: [name] [facility])  Discharge Diagnoses Active Problems:   Unstable angina pectoris [facility])   Coronary artery disease   Hyperlipidemia   Diabetes mellitus ([place]) type 2   Parkinson's disease ([place])   Chronic kidney disease   Essential hypertension   Coronary artery disease involving native coronary artery of native heart with unstable angina pectoris ([place])   NSTEMI (non-ST elevated myocardial infarction) ([place])   Hypokalemia  Allergies No Known Allergies  Consultant: none  Procedures  Cath 05/31/15 Conclusion    1. Mid RCA to Dist RCA lesion, 20% stenosed. The lesion was previously treated with a stent (unknown type). 2. Prox RCA lesion, 20% stenosed. 3. Mid RCA lesion, 20% stenosed. 4. 1st RPLB lesion, 99% stenosed. 5. Ost RPDA lesion, 60% stenosed. 6. LM lesion, 40% stenosed. 7. Ost Cx to Prox Cx lesion, 40% stenosed. 8. 2nd Mrg lesion, 60% stenosed. 9. Ost LAD to Mid LAD lesion, 30% stenosed. 10. Mid LAD to Dist LAD lesion, 60% stenosed. 11. Ost 2nd Diag to 2nd Diag lesion, 99% stenosed. 12. Ost 1st Diag to 1st Diag lesion, 99% stenosed.  1. NSTEMI 2. The right coronary artery is diffusely calcified. There is mild disease in the proximal and mid segments. The distal stent is patent. Beyond the stent the posterolateral branches are small in caliber and diffusely diseased. These vessels are too small for PCI.  3. The Circumflex terminates into an obtuse marginal branch. The obtuse marginal branch has 60% serial stenoses followed by diffuse disease in the small caliber distal segment of the obtuse marginal branch.  4. The LAD is severely calcified. The mid and distal vessel has diffuse 60-70% stenosis. Both Diagonal branches are small in caliber and diffusely disease, too  small for PCI.  5. No obvious culprit lesions.  6. No focal targets for PCI  Recommendations: Medical management of CAD. He has diffuse CAD with diffusely diseased branch vessels and distal segments of all major epicardial vessels. Anatomy is not favorable for CABG. He is not an operative candidate. No focal targets for PCI. Will admit to stepdown post cath. Will continue IV NTG. Transition to po nitrates tomorrow.    Echo 06/01/15 LV EF: 60% -  65%  ------------------------------------------------------------------- Indications:   Chest pain 786.51.  ------------------------------------------------------------------- History:  PMH:  Coronary artery disease. PMH:  Myocardial infarction. Risk factors: Hypertension. Diabetes mellitus. Dyslipidemia.  ------------------------------------------------------------------- Study Conclusions  - Left ventricle: The cavity size was normal. Wall thickness was increased in a pattern of moderate LVH. There was focal basal hypertrophy. Systolic function was normal. The estimated ejection fraction was in the range of 60% to 65%. Wall motion was normal; there were no regional wall motion abnormalities. - Mitral valve: Valve area by continuity equation (using LVOT flow): 2.24 cm^2.  History of Present Illness  This is a 81 y.o. male with a past medical history significant for long-standing coronary artery disease and multiple previous revascularization procedures, presenting 05/31/15 with accelerating angina and angina at rest. He was just discharged from [facility] 10/31 afternoon.  His first myocardial infarction occurred roughly 14 years ago. Most recently he received a stent about 2 years ago. His cardiologist is in [place] and all his coronary procedures have been performed at [facility]. Unfortunately, there is no information that can be retrieved from "CareEverywhere" and we have  been unable to find anyone  in medical records at [facility]. From the patient we understand that a CT scan was performed to rule out pulmonary embolism and then a nuclear stress test was performed with normal results. This is corroborated by a mention of these tests in the report from the radiologist that read his chest x-ray tonight. Kenneth Macias does not recall the name of the interventional cardiologist who performed his procedure and does not know the type of stent that he received.  He describes the chest discomfort that he is currently experiencing as identical to what he felt with his previous coronary events. It began about 2 weeks ago initially with exertion. It accelerated rapidly occurring with lower and lower thresholds of activity and is now present at rest. It was promptly relieved by intravenous nitroglycerin administered here in the emergency room.  His electrocardiogram is abnormal due to the presence of right bundle branch block and left anterior fascicular block, but no frank ischemic repolarization amenities are seen. We do not have an old EKG for comparison. Point-of-care cardiac enzymes are normal. His creatinine is 1.42 and he reports problems with chronic kidney disease. He sees a nephrologist with [facility]. He does not know his baseline creatinine. He has type 2 diabetes mellitus controlled with a relatively simple regimen of oral antidiabetics.  He denies dyspnea, syncope, focal neurological events of recent onset, intermittent claudication, lower showed edema, bleeding. He has been compliant with his medications including aspirin and clopidogrel. He reports a history of Parkinson's disease and dementia but appears to be fully alert and oriented today. He does not have a history of stroke or congestive heart failure. To his knowledge she has never had problems with arrhythmia or valvular heart disease.  Hospital Course  The patient was admitted to stepdown and placed on IV nitroglycerine. No IV heparin. Continued  aspirin and clopidogrel. He was taken to cath lab for definate evaluation of heart disease. Cath showed diffuse CAD with diffusely diseased branch vessels and distal segments of all major epicardial vessels. Anatomy is not favorable for CABG. He is not an operative candidate. No focal targets for PCI. Continue IV NTG overnight and transitioned to po nitrates in morning. Echo showed LV EF of 60-65%, moderate LVH and mitral valve using LVOT flow: 2.24 cm^2.  She has been seen by Dr.Croitoru today and deemed ready for discharge home. All follow-up appointments have been scheduled. Discharge medications are listed below.   He is not on statin therapy for unknown reason. LFT normal during admission. 05/31/2015: Cholesterol 198; HDL 38*; LDL Cholesterol 133*; Triglycerides 136; VLDL 27. LDL goal less than 70. Will start him on Lipitor 80mg . Consider OP f/u labs 6-8 weeks given statin initiation this admission.  Discharge Vitals Blood pressure 139/84, pulse 78, temperature 98.7 F (37.1 C), temperature source Oral, resp. rate 20, height 5\' 11"  (1.803 m), weight 197 lb 15.6 oz (89.8 kg), SpO2 98 %.  Filed Weights   05/31/15 0800  Weight: 197 lb 15.6 oz (89.8 kg)    Labs  CBC  Recent Labs  05/31/15 0159 05/31/15 0645 06/01/15 0320  WBC 9.7 8.0 12.6*  NEUTROABS 5.6  --   --   HGB 12.4* 12.6* 12.0*  HCT 37.6* 36.2* 35.3*  MCV 90.6 89.8 89.6  PLT 200 182 269   Basic Metabolic Panel  Recent Labs  05/31/15 0842 06/01/15 0320  NA 141 139  K 3.5 3.2*  CL 105 98*  CO2 27 24  GLUCOSE 112*  111*  BUN 13 11  CREATININE 1.33* 1.19  CALCIUM 9.1 8.9   Liver Function Tests  Recent Labs  05/31/15 0159  AST 26  ALT 14*  ALKPHOS 53  BILITOT 0.4  PROT 6.6  ALBUMIN 3.7   No results for input(s): LIPASE, AMYLASE in the last 72 hours. Cardiac Enzymes  Recent Labs  05/31/15 0159 05/31/15 0556 05/31/15 1136  TROPONINI 0.03 0.19* 0.67*   Hemoglobin A1C  Recent Labs  05/31/15 0842    HGBA1C 6.3*   Fasting Lipid Panel  Recent Labs  05/31/15 0842  CHOL 198  HDL 38*  LDLCALC 133*  TRIG 136  CHOLHDL 5.2    Disposition  Pt is being discharged home today in good condition.  Follow-up Plans & Appointments  Follow-up Information    Schedule an appointment as soon as possible for a visit with [name], MD.   Specialty:  Cardiology   Why:  1-2 weeks for hospital follow up   Contact information:   [address] [place] [address] [phone]           Discharge Instructions    Call MD for:  redness, tenderness, or signs of infection (pain, swelling, redness, odor or green/yellow discharge around incision site)    Complete by:  As directed      Diet - low sodium heart healthy    Complete by:  As directed      Discharge instructions    Complete by:  As directed   No driving for 5 days. No lifting over 5 lbs for 1 week. No sexual activity for 1 week.  Keep procedure site clean & dry. If you notice increased pain, swelling, bleeding or pus, call/return!  You may shower, but no soaking baths/hot tubs/pools for 1 week.  d.      Increase activity slowly    Complete by:  As directed          F/u Labs/Studies: Consider OP f/u labs 6-8 weeks given statin initiation this admission.  Discharge Medications    Medication List    TAKE these medications        aspirin EC 81 MG tablet  Take 81 mg by mouth daily.     atorvastatin 80 MG tablet  Commonly known as:  LIPITOR  Take 1 tablet (80 mg total) by mouth daily.     carbidopa-levodopa 25-250 MG tablet  Commonly known as:  SINEMET IR  Take 1 tablet by mouth 2 (two) times daily.     cholecalciferol 1000 UNITS tablet  Commonly known as:  VITAMIN D  Take 1,000 Units by mouth daily.     clopidogrel 75 MG tablet  Commonly known as:  PLAVIX  Take 75 mg by mouth daily.     donepezil 10 MG tablet  Commonly known as:  ARICEPT  Take 10 mg by mouth at bedtime.     entacapone 200 MG tablet   Commonly known as:  COMTAN  Take 200 mg by mouth 2 (two) times daily.     isosorbide mononitrate 30 MG 24 hr tablet  Commonly known as:  IMDUR  Take 1 tablet (30 mg total) by mouth daily.     LINZESS 290 MCG Caps capsule  Generic drug:  Linaclotide  Take 290 mcg by mouth daily.     metoprolol succinate 100 MG 24 hr tablet  Commonly known as:  TOPROL-XL  Take 100 mg by mouth daily. Take with or immediately following a meal.  multivitamin with minerals Tabs tablet  Take 1 tablet by mouth daily.     nitroGLYCERIN 0.4 MG SL tablet  Commonly known as:  NITROSTAT  Place 0.4 mg under the tongue every 5 (five) minutes as needed for chest pain.     ONGLYZA 5 MG Tabs tablet  Generic drug:  saxagliptin HCl  Take 5 mg by mouth daily.     pantoprazole 40 MG tablet  Commonly known as:  PROTONIX  Take 40 mg by mouth daily.     ramipril 10 MG capsule  Commonly known as:  ALTACE  Take 10 mg by mouth 2 (two) times daily.        Duration of Discharge Encounter   Greater than 30 minutes including physician time.  Signed, Vivien Barretto PA-C 06/01/2015, 1:29 PM

## 2015-06-01 NOTE — Progress Notes (Signed)
  Echocardiogram 2D Echocardiogram has been performed.  Kenneth Macias 06/01/2015, 10:03 AM

## 2015-06-01 NOTE — Progress Notes (Signed)
Discharged home by wheelchair, stable, accompanied by daughter. Discharged instructions given to pt. and daughter. Belongings taken home.

## 2015-06-01 NOTE — Progress Notes (Signed)
Patient Name: Kenneth Macias Date of Encounter: 06/01/2015  Active Problems:   Unstable angina pectoris ([place])   Coronary artery disease   Hyperlipidemia   Diabetes mellitus ([place]) type 2   Parkinson's disease ([place])   Chronic kidney disease   Essential hypertension   Coronary artery disease involving native coronary artery of native heart with unstable angina pectoris ([place])   NSTEMI (non-ST elevated myocardial infarction) ([place])   Length of Stay: 1  SUBJECTIVE  Angina free, wants to go home  CURRENT MEDS . aspirin EC  81 mg Oral Daily  . carbidopa-levodopa  1 tablet Oral Q12H  . cholecalciferol  1,000 Units Oral Daily  . clopidogrel  75 mg Oral Daily  . donepezil  10 mg Oral QHS  . entacapone  200 mg Oral BID  . insulin aspart  0-15 Units Subcutaneous TID WC  . isosorbide mononitrate  30 mg Oral Daily  . Linaclotide  290 mcg Oral Daily  . linagliptin  5 mg Oral Daily  . metoprolol succinate  100 mg Oral Daily  . morphine  1 mg Intravenous Once  . multivitamin with minerals  1 tablet Oral Daily  . pantoprazole  40 mg Oral Daily  . ramipril  10 mg Oral BID  . sodium chloride  3 mL Intravenous Q12H    OBJECTIVE   Intake/Output Summary (Last 24 hours) at 06/01/15 1247 Last data filed at 06/01/15 1200  Gross per 24 hour  Intake 208.55 ml  Output   1351 ml  Net -1142.45 ml   Filed Weights   05/31/15 0800  Weight: 197 lb 15.6 oz (89.8 kg)    PHYSICAL EXAM Filed Vitals:   06/01/15 0600 06/01/15 0701 06/01/15 0800 06/01/15 1205  BP: 137/94 158/87 148/78 139/84  Pulse:  84  78  Temp:  99.4 F (37.4 C)  98.7 F (37.1 C)  TempSrc:  Oral  Oral  Resp: 20 18  20   Height:      Weight:      SpO2:  97%  98%   General: Alert, oriented x3, no distress Head: no evidence of trauma, PERRL, EOMI, no exophtalmos or lid lag, no myxedema, no xanthelasma; normal ears, nose and oropharynx Neck: normal jugular venous pulsations and no hepatojugular reflux; brisk carotid pulses  without delay and no carotid bruits Chest: clear to auscultation, no signs of consolidation by percussion or palpation, normal fremitus, symmetrical and full respiratory excursions Cardiovascular: normal position and quality of the apical impulse, regular rhythm, normal first and second heart sounds, no rubs or gallops, 1/6 Ao ejection murmur, no diastolic murmur Abdomen: no tenderness or distention, no masses by palpation, no abnormal pulsatility or arterial bruits, normal bowel sounds, no hepatosplenomegaly Extremities: no clubbing, cyanosis or edema; 2+ radial, ulnar and brachial pulses bilaterally; 2+ right femoral, posterior tibial and dorsalis pedis pulses; 2+ left femoral, posterior tibial and dorsalis pedis pulses; no subclavian or femoral bruits Neurological: grossly nonfocal  LABS  CBC  Recent Labs  05/31/15 0159 05/31/15 0645 06/01/15 0320  WBC 9.7 8.0 12.6*  NEUTROABS 5.6  --   --   HGB 12.4* 12.6* 12.0*  HCT 37.6* 36.2* 35.3*  MCV 90.6 89.8 89.6  PLT 200 182 456   Basic Metabolic Panel  Recent Labs  05/31/15 0842 06/01/15 0320  NA 141 139  K 3.5 3.2*  CL 105 98*  CO2 27 24  GLUCOSE 112* 111*  BUN 13 11  CREATININE 1.33* 1.19  CALCIUM 9.1 8.9   Liver  Function Tests  Recent Labs  05/31/15 0159  AST 26  ALT 14*  ALKPHOS 53  BILITOT 0.4  PROT 6.6  ALBUMIN 3.7   No results for input(s): LIPASE, AMYLASE in the last 72 hours. Cardiac Enzymes  Recent Labs  05/31/15 0159 05/31/15 0556 05/31/15 1136  TROPONINI 0.03 0.19* 0.67*   BNP Invalid input(s): POCBNP D-Dimer No results for input(s): DDIMER in the last 72 hours. Hemoglobin A1C  Recent Labs  05/31/15 0842  HGBA1C 6.3*   Fasting Lipid Panel  Recent Labs  05/31/15 0842  CHOL 198  HDL 38*  LDLCALC 133*  TRIG 136  CHOLHDL 5.2   Thyroid Function Tests No results for input(s): TSH, T4TOTAL, T3FREE, THYROIDAB in the last 72 hours.  Invalid input(s): [place]  Radiology  Studies Imaging results have been reviewed and Dg Chest Port 1 View  05/31/2015  CLINICAL DATA:  Chest pain and shortness of Breath EXAM: PORTABLE CHEST - 1 VIEW COMPARISON:  Chest x-ray, chest CT and nuclear cardiac exam from previous day at [facility] FINDINGS: Cardiac shadow is within normal limits. The lungs are clear bilaterally. No acute bony abnormality is noted. IMPRESSION: No acute abnormality noted. Should be noted the patient had negative CTA of the chest yesterday at [facility] A negative nuclear cardiac exam was performed as well. Electronically Signed   By: Inez Catalina M.D.   On: 05/31/2015 02:17    TELE NSr  ECHO Normal LVEF and wall motion  ASSESSMENT AND PLAN  Mr. Boys has multiple severe stenoses in relatively small distal and secondary coronary branches. No clear culprit and no good revascularization options due to small vessel caliber. Old RCA stent widely patent. Preserved LV myocardial function. No arrhythmia. Small NSTEMI by enzymes. Medical management: ASA + clopidogrel, statin, beta blocker and add nitrates. Option for Ranexa if conventional antianginals fail. F/U with Dr. [name]. I discussed his hospital course with Dr. Joya Gaskins nurse, Anderson Malta.   Sanda Klein, MD, [facility] [phone] office [phone] pager 06/01/2015 12:47 PM

## 2015-06-02 ENCOUNTER — Telehealth: Payer: Self-pay | Admitting: Cardiovascular Disease

## 2015-06-02 NOTE — Telephone Encounter (Signed)
D/C phone call .Marland Kitchen Patient appt is in [place].Marland Kitchen Please call

## 2015-06-02 NOTE — Telephone Encounter (Signed)
Patient contacted regarding discharge from [facility] on 06/01/2015.  Patient understands to follow up with provider - patient's daughter will call Dr. [name] to set him up appointment Patient understands discharge instructions? YES Patient understands medications and regiment? YES Patient understands to bring all medications to this visit? N/A

## 2016-01-06 DIAGNOSIS — W19XXXA Unspecified fall, initial encounter: Secondary | ICD-10-CM

## 2016-01-06 DIAGNOSIS — F0280 Dementia in other diseases classified elsewhere without behavioral disturbance: Secondary | ICD-10-CM

## 2016-01-06 DIAGNOSIS — S0101XA Laceration without foreign body of scalp, initial encounter: Secondary | ICD-10-CM | POA: Insufficient documentation

## 2016-01-06 DIAGNOSIS — G309 Alzheimer's disease, unspecified: Secondary | ICD-10-CM

## 2016-01-06 DIAGNOSIS — I609 Nontraumatic subarachnoid hemorrhage, unspecified: Secondary | ICD-10-CM

## 2016-01-06 HISTORY — DX: Dementia in other diseases classified elsewhere, unspecified severity, without behavioral disturbance, psychotic disturbance, mood disturbance, and anxiety: F02.80

## 2016-01-06 HISTORY — DX: Nontraumatic subarachnoid hemorrhage, unspecified: I60.9

## 2016-01-06 HISTORY — DX: Unspecified fall, initial encounter: W19.XXXA

## 2016-01-09 DIAGNOSIS — R55 Syncope and collapse: Secondary | ICD-10-CM

## 2016-01-09 HISTORY — DX: Syncope and collapse: R55

## 2016-05-11 DIAGNOSIS — D72829 Elevated white blood cell count, unspecified: Secondary | ICD-10-CM

## 2016-05-11 DIAGNOSIS — I951 Orthostatic hypotension: Secondary | ICD-10-CM

## 2016-05-11 HISTORY — DX: Elevated white blood cell count, unspecified: D72.829

## 2016-05-11 HISTORY — DX: Orthostatic hypotension: I95.1

## 2016-06-10 DIAGNOSIS — D729 Disorder of white blood cells, unspecified: Secondary | ICD-10-CM | POA: Insufficient documentation

## 2016-06-10 DIAGNOSIS — E119 Type 2 diabetes mellitus without complications: Secondary | ICD-10-CM

## 2016-06-10 HISTORY — DX: Type 2 diabetes mellitus without complications: E11.9

## 2016-06-11 DIAGNOSIS — I4892 Unspecified atrial flutter: Secondary | ICD-10-CM

## 2016-06-11 HISTORY — DX: Unspecified atrial flutter: I48.92

## 2016-06-25 DIAGNOSIS — IMO0001 Reserved for inherently not codable concepts without codable children: Secondary | ICD-10-CM | POA: Insufficient documentation

## 2016-07-10 DIAGNOSIS — Z01810 Encounter for preprocedural cardiovascular examination: Secondary | ICD-10-CM | POA: Insufficient documentation

## 2016-11-12 DIAGNOSIS — N289 Disorder of kidney and ureter, unspecified: Secondary | ICD-10-CM | POA: Diagnosis not present

## 2016-11-12 DIAGNOSIS — D649 Anemia, unspecified: Secondary | ICD-10-CM | POA: Diagnosis not present

## 2016-11-20 DIAGNOSIS — N189 Chronic kidney disease, unspecified: Secondary | ICD-10-CM | POA: Diagnosis not present

## 2016-11-20 DIAGNOSIS — D509 Iron deficiency anemia, unspecified: Secondary | ICD-10-CM | POA: Diagnosis not present

## 2016-12-18 DIAGNOSIS — I4892 Unspecified atrial flutter: Secondary | ICD-10-CM

## 2016-12-18 HISTORY — DX: Unspecified atrial flutter: I48.92

## 2017-01-03 DIAGNOSIS — C189 Malignant neoplasm of colon, unspecified: Secondary | ICD-10-CM

## 2017-01-03 HISTORY — DX: Malignant neoplasm of colon, unspecified: C18.9

## 2017-02-04 DIAGNOSIS — D508 Other iron deficiency anemias: Secondary | ICD-10-CM | POA: Diagnosis not present

## 2017-02-04 DIAGNOSIS — C189 Malignant neoplasm of colon, unspecified: Secondary | ICD-10-CM | POA: Diagnosis not present

## 2017-03-12 NOTE — Progress Notes (Signed)
Cardiology Office Note:    Date:  03/13/2017   ID:  Kenneth Macias, DOB 09-13-1933, MRN [id number]  PCP:  Charletta Cousin, MD (Inactive)  Cardiologist:  [name], MD    Referring MD: Dr. Noberto Retort   ASSESSMENT:    1. Coronary artery disease of native artery of native heart with stable angina pectoris ([place])   2. Essential hypertension   3. Orthostatic hypotension   4. Hyperlipidemia, unspecified hyperlipidemia type   5. Preoperative cardiovascular examination    PLAN:   Preoperative cardiovascular evaluation  Surgeon: Dr. Noberto Retort Procedure: Colon resection  The surgery is elective Active cardiac problems  include pacemaker with recent interrogation evaluation he is not pacemaker dependent as well as CAD which is stable in greater than one year remote his last non-ST elevation MI. At this time is not a candidate for elective cardiac interventions he is optimized and I would proceed with planned surgical procedure. The cardiac status is stable . The planned procedure is intermediate risk. The cardiac risk factors are CAD bradycardia and pacemaker  The functional capacity is 4 mets or greater  no, he is severely limited his Parkinson's  Recent cardiac tests performed EKG in my office today normal dual-chamber pacemaker function  Given the above his overall risk for the planned procedure is acceptable  Antiplatelet/ anticoagulant recommendation: None he is off antiplatelet therapy with GI blood loss  Other cardiac medication or device recommendation: He may require her Smart magnet intraoperatively with cautery  Anesthesia recommendation: None  Observation, monitoring,and postoperative test recommendation: I would place him in a monitored telemetry bed for 48 hours postoperatively and his case check a troponin EKG postoperative day one to and if there is any difficulty consult [facility] care The patient is optimized from a cardiology perspective: Yes    In  order of problems listed above:  Stable CAD continue current medical treatment proceed with planned surgery please resume his oral medications postoperatively Stable hypertension, he is at risk for orthostatic hypotension Parkinson's Stable no recurrent symptomatic events orthostatic hypotension Stable continue his current low intensity statin and resume postop  Next appointment: 6 months   Medication Adjustments/Labs and Tests Ordered: Current medicines are reviewed at length with the patient today.  Concerns regarding medicines are outlined above.  Orders Placed This Encounter  Procedures  . EKG 12-Lead   No orders of the defined types were placed in this encounter.   Chief Complaint  Patient presents with  . Follow-up    prior to colon surgery  . Edema    History of Present Illness:    Kenneth Macias is a 83 y.o. male with a hx of CAD and nonSTEMI 06/11/15, hypertension, CKD stage 3, hyperlipidemia, T2DM, dual chamber pacemaker implant on 07/10/16 for  syncope and sinus bradycardia  last seen 6 months ago. He relates he is markedly improved after pacemaker insertion and has had no recurrent syncope. His device was recently interrogated report is in this note function. He's had no anginal discomfort current medications and is off aspirin and clopidogrel which GI blood loss. He anticipates colon resection for cancer. He has had no angina dyspnea palpitation or syncope. He is not a candidate for cardiac revascularization preoperatively. Compliance with diet, lifestyle and medications: Yes Past Medical History:  Diagnosis Date  . Adenocarcinoma of colon ([place]) 01/03/2017  . Alzheimer's disease 01/06/2016  . Atrial flutter ([place]) 12/18/2016  . Chest pain 05/29/2015  . Chronic kidney disease 05/31/2015  . Coronary artery  disease   . Coronary artery disease involving native coronary artery    Overview:  Overview:  1. PCI and BMS to RCA 2011 2. PCI and DES to RCA 07/30/11  Cardiac cath  05/31/15: Cath 05/31/15 Conclusion  1. Mid RCA to Dist RCA lesion, 20% stenosed. The lesion was previously treated with a stent (unknown type). 2. Prox RCA lesion, 20% stenosed. 3. Mid RCA lesion, 20% stenosed. 4. 1st RPLB lesion, 99% stenosed. 5. Ost RPDA lesion, 60% stenosed. 6. LM lesion, 40% stenosed. 7. Ost Cx to Prox Cx lesion, 40% stenosed. 8. 2nd Mrg lesion, 60% stenosed. 9. Ost LAD to Mid LAD lesion, 30% stenosed. 10. Mid LAD to Dist LAD lesion, 60% stenosed. 11. Ost 2nd Diag to 2nd Diag lesion, 99% stenosed. 12. Ost 1st Diag to 1st Diag lesion, 99% stenosed.  1. Unstable angina 2. The right coronary artery is diffusely calcified. There is mild disease in the proximal and mid segments. The distal stent is patent. Beyond the stent the posterolateral branches are small in caliber and diffusely diseased. These vessels are too small for PCI.  3. The Circumflex terminates into an obtuse margi  . Diabetes mellitus without complication ([place])   . DM (diabetes mellitus) ([place]) 05/31/2015  . Fall 01/06/2016  . HTN (hypertension) 05/31/2015  . Hyperlipidemia 05/31/2015  . Hypertension   . Hypertensive kidney disease with stage 3 chronic kidney disease 04/25/2015  . Laceration of scalp 01/06/2016   Overview:  Overview:  Fall  . Leukocytosis 05/11/2016  . Myocardial infarction ([place]) 07/30/2014   Overview:  x2  . Neutrophilic leukocytosis 56/43/3295  . Normal left ventricular systolic function and wall motion 06/25/2016  . NSTEMI (non-ST elevated myocardial infarction) ([place]) 05/31/2015  . Orthostatic hypotension 05/11/2016  . Pacemaker reprogramming/check 07/10/2016  . Parkinson disease ([place]) 05/31/2015  . Parkinson's disease ([place])   . Paroxysmal atrial flutter ([place]) 06/11/2016  . Subarachnoid hemorrhage ([place]) 01/06/2016  . Syncope 01/09/2016  . Type 2 diabetes mellitus without complication, without long-term current use of insulin ([place]) 06/10/2016  . Unstable angina pectoris ([place]) 05/31/2015    Past Surgical  History:  Procedure Laterality Date  . CARDIAC CATHETERIZATION N/A 05/31/2015   Procedure: Left Heart Cath and Coronary Angiography;  Surgeon: Burnell Blanks, MD;  Location: [facility];  Service: Cardiovascular;  Laterality: N/A;  . COLONOSCOPY    . CORONARY ANGIOPLASTY WITH STENT PLACEMENT    . HAND SURGERY      Current Medications: Current Meds  Medication Sig  . Carbidopa-Levodopa ER 61.25-245 MG CPCR Take 61.25-245 mg by mouth 3 (three) times daily.  . cholecalciferol (VITAMIN D) 1000 UNITS tablet Take 1,000 Units by mouth daily.  . entacapone (COMTAN) 200 MG tablet Take 200 mg by mouth 3 (three) times daily.   . fludrocortisone (FLORINEF) 0.1 MG tablet Take 0.1 mg by mouth daily.  Marland Kitchen FLUoxetine (PROZAC) 10 MG tablet Take 10 mg by mouth daily.  . isosorbide mononitrate (IMDUR) 30 MG 24 hr tablet Take 1 tablet (30 mg total) by mouth daily.  . nitroGLYCERIN (NITROSTAT) 0.4 MG SL tablet Place 0.4 mg under the tongue every 5 (five) minutes as needed for chest pain.  . pantoprazole (PROTONIX) 40 MG tablet Take 40 mg by mouth daily.  . pravastatin (PRAVACHOL) 80 MG tablet Take 80 mg by mouth daily.  . ranolazine (RANEXA) 1000 MG SR tablet Take 1,000 mg by mouth 2 (two) times daily.  . rasagiline (AZILECT) 1 MG TABS tablet Take 1 mg  by mouth daily.  . saxagliptin HCl (ONGLYZA) 5 MG TABS tablet Take 5 mg by mouth daily.  . [DISCONTINUED] aspirin EC 325 MG tablet Take 325 mg by mouth daily.     Allergies:   Patient has no known allergies.   Social History   Social History  . Marital status: Widowed    Spouse name: N/A  . Number of children: N/A  . Years of education: N/A   Social History Main Topics  . Smoking status: Never Smoker  . Smokeless tobacco: Current User    Types: Chew  . Alcohol use No  . Drug use: No  . Sexual activity: Not Asked   Other Topics Concern  . None   Social History Narrative  . None     Family History: The patient's family history  includes CAD in his daughter and son; Cancer in his mother; Diabetes in his father; Heart attack in his father; Heart disease in his daughter and father. ROS:   Please see the history of present illness.    All other systems reviewed and are negative.  EKGs/Labs/Other Studies Reviewed:    The following studies were reviewed today:  EKG:  EKG ordered today.  The ekg ordered today demonstrates Dual-chamber pacemaker normal function REMOTE MONITORING ASSESSMENT  Date of Transmission: February 26, 2017  Patient MR Number: [id number]  Patient Name: Derrall Hicks Vosler, [birth year], 83 y.o. Following Provider: Mahala Menghini, MD Primary Care Provider: Heywood Bene, MD Manufacturer of Device: Medtronic Type of Device: Dual Chamber Pacemaker Presenting Rhythm: AP VS @ 60bpm Percentage RV Pacing: 94% RV Paced Percentage Biventricular Pacing: Not Applicable Device Findings:  Please see downloaded PDF file of transmission under Media Tab for full details of device interrogation to include, when applicable, battery status/charge time, lead trend data, and programmed parameters.  No Significant Atrial or Ventricular Arrhythmias  Battery Status Adequate Battery Voltage  Lead Trends Lead Trends Stable     Recent Labs: No results found for requested labs within last 8760 hours.  Recent Lipid Panel    Component Value Date/Time   CHOL 198 05/31/2015 0842   TRIG 136 05/31/2015 0842   HDL 38 (L) 05/31/2015 0842   CHOLHDL 5.2 05/31/2015 0842   VLDL 27 05/31/2015 0842   LDLCALC 133 (H) 05/31/2015 0842    Physical Exam:    VS:  BP (!) 96/56 (BP Location: Right Arm, Patient Position: Sitting)   Pulse 62   Ht 5\' 11"  (1.803 m)   Wt 194 lb 1.9 oz (88.1 kg)   SpO2 96%   BMI 27.07 kg/m     Wt Readings from Last 3 Encounters:  03/13/17 194 lb 1.9 oz (88.1 kg)  05/31/15 197 lb 15.6 oz (89.8 kg)     GEN:  Well nourished, well developed in no acute distress HEENT: Normal NECK: No  JVD; No carotid bruits LYMPHATICS: No lymphadenopathy CARDIAC: RRR, no murmurs, rubs, gallops RESPIRATORY:  Clear to auscultation without rales, wheezing or rhonchi  ABDOMEN: Soft, non-tender, non-distended MUSCULOSKELETAL:  No edema; No deformity  SKIN: Warm and dry NEUROLOGIC:  Alert and oriented x 3 PSYCHIATRIC:  Normal affect    Signed, [name], MD  03/13/2017 3:21 PM    [facility]

## 2017-03-13 ENCOUNTER — Ambulatory Visit (INDEPENDENT_AMBULATORY_CARE_PROVIDER_SITE_OTHER): Payer: Medicare Other | Admitting: Cardiology

## 2017-03-13 ENCOUNTER — Encounter: Payer: Self-pay | Admitting: Cardiology

## 2017-03-13 VITALS — BP 96/56 | HR 62 | Ht 71.0 in | Wt 194.1 lb

## 2017-03-13 DIAGNOSIS — I951 Orthostatic hypotension: Secondary | ICD-10-CM | POA: Diagnosis not present

## 2017-03-13 DIAGNOSIS — I25118 Atherosclerotic heart disease of native coronary artery with other forms of angina pectoris: Secondary | ICD-10-CM | POA: Diagnosis not present

## 2017-03-13 DIAGNOSIS — Z01810 Encounter for preprocedural cardiovascular examination: Secondary | ICD-10-CM | POA: Diagnosis not present

## 2017-03-13 DIAGNOSIS — I10 Essential (primary) hypertension: Secondary | ICD-10-CM | POA: Diagnosis not present

## 2017-03-13 DIAGNOSIS — E785 Hyperlipidemia, unspecified: Secondary | ICD-10-CM | POA: Diagnosis not present

## 2017-03-13 NOTE — Patient Instructions (Signed)
Medication Instructions:  Your physician recommends that you continue on your current medications as directed. Please refer to the Current Medication list given to you today.   Labwork: None  Testing/Procedures: You had an EKG today.   Follow-Up: Your physician recommends that you schedule a follow-up appointment in: 3 months.  Any Other Special Instructions Will Be Listed Below (If Applicable).     If you need a refill on your cardiac medications before your next appointment, please call your pharmacy.   

## 2017-03-18 DIAGNOSIS — C182 Malignant neoplasm of ascending colon: Secondary | ICD-10-CM | POA: Diagnosis not present

## 2017-03-18 DIAGNOSIS — D509 Iron deficiency anemia, unspecified: Secondary | ICD-10-CM | POA: Diagnosis not present

## 2017-03-25 DIAGNOSIS — Z01818 Encounter for other preprocedural examination: Secondary | ICD-10-CM | POA: Diagnosis not present

## 2017-04-22 DIAGNOSIS — R970 Elevated carcinoembryonic antigen [CEA]: Secondary | ICD-10-CM | POA: Diagnosis not present

## 2017-04-22 DIAGNOSIS — D509 Iron deficiency anemia, unspecified: Secondary | ICD-10-CM | POA: Diagnosis not present

## 2017-04-22 DIAGNOSIS — C182 Malignant neoplasm of ascending colon: Secondary | ICD-10-CM | POA: Diagnosis not present

## 2017-04-29 NOTE — Progress Notes (Signed)
Cardiology Office Note:    Date:  04/30/2017   ID:  Kenneth Macias, DOB 09/10/1933, MRN [id number]  PCP:  Charletta Cousin, MD (Inactive)  Cardiologist:  [name], MD    Referring MD: Melony Overly, MD    ASSESSMENT:    1. Orthostatic hypotension   2. Pacemaker   3. Coronary artery disease of native artery of native heart with stable angina pectoris (HCC)   4. Paroxysmal atrial flutter ([place])   5. Essential hypertension    PLAN:    In order of problems listed above:  1. Severe symptomatic due to his movement disorder.we discussed treatments to mitigate this.He will use support hose and an abdominal binder. His family was unsure adequate oral intake.He'll start on a low doseof midodrine with a goal of systolic blood pressure greater than 100 standing. His family is aware and accepts that he may have supine hypertension as a consequenceStable function he isn't on home telemetry program 2. Stable following in  device clinic 3. Stable continue medical treatment he is a very poor candidate for invasive cardiac procedures 4. Stable no clinical recurrence with device telemetry 5. Worsened with symptomatic orthostatic hypotension I will give him no medications that lower his blood pressure.   Next appointment: 4 weeks   Medication Adjustments/Labs and Tests Ordered: Current medicines are reviewed at length with the patient today.  Concerns regarding medicines are outlined above.  No orders of the defined types were placed in this encounter.  Meds ordered this encounter  Medications  . midodrine (PROAMATINE) 5 MG tablet    Sig: Take 1 tablet (5 mg total) by mouth 3 (three) times daily with meals.    Dispense:  90 tablet    Refill:  3    Chief Complaint  Patient presents with  . Follow-up    to discuss BP dropping while standing.   . Fatigue  . Dizziness    History of Present Illness:    Kenneth Macias is a 83 y.o. male with a hx of CAD, atrial flutter previous traumatic  ICH,hypertension , Parkinson's and a dual chamber pacemaker for bradycardia and syncope last seen by me in November 2017.he has had no recurrent atrial tachyarrhythmia or syncope. Unfortunately wih the progression of his Parkinson's he now is developed severe symptomatic orthostatic hypotension with systolic blood pressures in the range of 70-80 and unsteady gait and falls. He has multiple blood pressures checked daily with his family.he is wearing support hose sporadically. He has had no angina dyspnea palpitation or TIA. He is not anticoagulated. Compliance with diet, lifestyle and medications: yes Past Medical History:  Diagnosis Date  . Adenocarcinoma of colon ([place]) 01/03/2017  . Alzheimer's disease 01/06/2016  . Atrial flutter ([place]) 12/18/2016  . Chest pain 05/29/2015  . Chronic kidney disease 05/31/2015  . Coronary artery disease   . Coronary artery disease involving native coronary artery    Overview:  Overview:  1. PCI and BMS to RCA 2011 2. PCI and DES to RCA 07/30/11  Cardiac cath 05/31/15: Cath 05/31/15 Conclusion  1. Mid RCA to Dist RCA lesion, 20% stenosed. The lesion was previously treated with a stent (unknown type). 2. Prox RCA lesion, 20% stenosed. 3. Mid RCA lesion, 20% stenosed. 4. 1st RPLB lesion, 99% stenosed. 5. Ost RPDA lesion, 60% stenosed. 6. LM lesion, 40% stenosed. 7. Ost Cx to Prox Cx lesion, 40% stenosed. 8. 2nd Mrg lesion, 60% stenosed. 9. Ost LAD to Mid LAD lesion, 30% stenosed. 10. Mid LAD to  Dist LAD lesion, 60% stenosed. 11. Ost 2nd Diag to 2nd Diag lesion, 99% stenosed. 12. Ost 1st Diag to 1st Diag lesion, 99% stenosed.  1. Unstable angina 2. The right coronary artery is diffusely calcified. There is mild disease in the proximal and mid segments. The distal stent is patent. Beyond the stent the posterolateral branches are small in caliber and diffusely diseased. These vessels are too small for PCI.  3. The Circumflex terminates into an obtuse margi  . Diabetes mellitus without  complication ([place])   . DM (diabetes mellitus) ([place]) 05/31/2015  . Fall 01/06/2016  . HTN (hypertension) 05/31/2015  . Hyperlipidemia 05/31/2015  . Hypertension   . Hypertensive kidney disease with stage 3 chronic kidney disease ([place]) 04/25/2015  . Laceration of scalp 01/06/2016   Overview:  Overview:  Fall  . Leukocytosis 05/11/2016  . Myocardial infarction ([place]) 07/30/2014   Overview:  x2  . Neutrophilic leukocytosis 16/04/9603  . Normal left ventricular systolic function and wall motion 06/25/2016  . NSTEMI (non-ST elevated myocardial infarction) ([place]) 05/31/2015  . Orthostatic hypotension 05/11/2016  . Pacemaker reprogramming/check 07/10/2016  . Parkinson disease ([place]) 05/31/2015  . Parkinson's disease ([place])   . Paroxysmal atrial flutter ([place]) 06/11/2016  . Subarachnoid hemorrhage ([place]) 01/06/2016  . Syncope 01/09/2016  . Type 2 diabetes mellitus without complication, without long-term current use of insulin ([place]) 06/10/2016  . Unstable angina pectoris ([place]) 05/31/2015    Past Surgical History:  Procedure Laterality Date  . CARDIAC CATHETERIZATION N/A 05/31/2015   Procedure: Left Heart Cath and Coronary Angiography;  Surgeon: Burnell Blanks, MD;  Location: [facility];  Service: Cardiovascular;  Laterality: N/A;  . COLONOSCOPY    . CORONARY ANGIOPLASTY WITH STENT PLACEMENT    . HAND SURGERY      Current Medications: Current Meds  Medication Sig  . Carbidopa-Levodopa ER 61.25-245 MG CPCR Take 61.25-245 mg by mouth 3 (three) times daily.  . cholecalciferol (VITAMIN D) 1000 UNITS tablet Take 1,000 Units by mouth daily.  . entacapone (COMTAN) 200 MG tablet Take 200 mg by mouth 3 (three) times daily.   Marland Kitchen FLUoxetine (PROZAC) 10 MG tablet Take 10 mg by mouth daily.  . isosorbide mononitrate (IMDUR) 30 MG 24 hr tablet Take 1 tablet (30 mg total) by mouth daily.  . pantoprazole (PROTONIX) 40 MG tablet Take 40 mg by mouth daily.  . ranolazine (RANEXA) 1000 MG SR tablet Take 1,000  mg by mouth 2 (two) times daily.  . rasagiline (AZILECT) 1 MG TABS tablet Take 1 mg by mouth daily.  . saxagliptin HCl (ONGLYZA) 5 MG TABS tablet Take 5 mg by mouth daily.     Allergies:   Patient has no known allergies.   Social History   Social History  . Marital status: Widowed    Spouse name: N/A  . Number of children: N/A  . Years of education: N/A   Social History Main Topics  . Smoking status: Never Smoker  . Smokeless tobacco: Current User    Types: Chew  . Alcohol use No  . Drug use: No  . Sexual activity: Not Asked   Other Topics Concern  . None   Social History Narrative  . None     Family History: The patient's family history includes CAD in his daughter and son; Cancer in his mother; Diabetes in his father; Heart attack in his father; Heart disease in his daughter and father. ROS:   Please see the history of present illness.  All other systems reviewed and are negative.  EKGs/Labs/Other Studies Reviewed:    The following studies were reviewed today: Pacemaker check: REMOTE MONITORING ASSESSMENT  Date of Transmission: February 26, 2017  Patient MR Number: [id number]  Patient Name: Eliott Amparan Fusaro, 05-13-1934, 83 y.o.  Following Provider: Mahala Menghini, MD  Primary Care Provider: Heywood Bene, MD ______________________________________________________________________  Manufacturer of Device: Medtronic  Type of Device: Dual Chamber Pacemaker ______________________________________________________________________  See scanned/downloaded PDF report for model numbers, serial numbers, and date(s) of implant.  Presenting Rhythm: AP VS @ 60bpm  Percentage RV Pacing: 94% RV Paced  Percentage Biventricular Pacing: Not Applicable  ______________________________________________________________________  Device Findings:   Please see downloaded PDF file of transmission under Media Tab for full details of device interrogation to include, when  applicable, battery status/charge time, lead trend data, and programmed parameters.   No Significant Atrial or Ventricular Arrhythmias  Battery Status Adequate Battery Voltage  Lead Trends Lead Trends Stable       Recent Labs:requested from his PCP No results found for requested labs within last 8760 hours.  Recent Lipid Panel    Component Value Date/Time   CHOL 198 05/31/2015 0842   TRIG 136 05/31/2015 0842   HDL 38 (L) 05/31/2015 0842   CHOLHDL 5.2 05/31/2015 0842   VLDL 27 05/31/2015 0842   LDLCALC 133 (H) 05/31/2015 0842    Physical Exam:    VS:  BP 110/68 (BP Location: Right Arm, Patient Position: Sitting)   Pulse 61   Ht 5\' 11"  (1.803 m)   Wt 182 lb (82.6 kg)   SpO2 96%   BMI 25.38 kg/m     Wt Readings from Last 3 Encounters:  04/30/17 182 lb (82.6 kg)  03/13/17 194 lb 1.9 oz (88.1 kg)  05/31/15 197 lb 15.6 oz (89.8 kg)     GEN:   Well nourished, well developed in no acute distress HEENT: Normal NECK: No JVD; No carotid bruits LYMPHATICS: No lymphadenopathy CARDIAC: RRR, no murmurs, rubs, gallops RESPIRATORY:  Clear to auscultation without rales, wheezing or rhonchi  ABDOMEN: Soft, non-tender, non-distended MUSCULOSKELETAL:  No edema; No deformity  SKIN: Warm and dry NEUROLOGIC:  Alert and oriented x 3 PSYCHIATRIC:  Normal affect    Signed, [name], MD  04/30/2017 12:06 PM    [place]

## 2017-04-30 ENCOUNTER — Ambulatory Visit (INDEPENDENT_AMBULATORY_CARE_PROVIDER_SITE_OTHER): Payer: Medicare Other | Admitting: Cardiology

## 2017-04-30 ENCOUNTER — Encounter: Payer: Self-pay | Admitting: Cardiology

## 2017-04-30 VITALS — BP 110/68 | HR 61 | Ht 71.0 in | Wt 182.0 lb

## 2017-04-30 DIAGNOSIS — Z950 Presence of cardiac pacemaker: Secondary | ICD-10-CM

## 2017-04-30 DIAGNOSIS — I25118 Atherosclerotic heart disease of native coronary artery with other forms of angina pectoris: Secondary | ICD-10-CM

## 2017-04-30 DIAGNOSIS — I10 Essential (primary) hypertension: Secondary | ICD-10-CM

## 2017-04-30 DIAGNOSIS — I4892 Unspecified atrial flutter: Secondary | ICD-10-CM | POA: Diagnosis not present

## 2017-04-30 DIAGNOSIS — I951 Orthostatic hypotension: Secondary | ICD-10-CM | POA: Diagnosis not present

## 2017-04-30 MED ORDER — MIDODRINE HCL 5 MG PO TABS: 5.0000 mg | ORAL_TABLET | Freq: Three times a day (TID) | ORAL | 3 refills | Status: DC

## 2017-04-30 NOTE — Patient Instructions (Addendum)
Medication Instructions:  Your physician has recommended you make the following change in your medication: 1.) START taking midodrine 5 mg 3x daily.   Labwork: None   Testing/Procedures: None   Follow-Up: Your physician recommends that you schedule a follow-up appointment in: 4 weeks   Any Other Special Instructions Will Be Listed Below (If Applicable).  Please note that any paperwork needing to be filled out by the provider will need to be addressed at the front desk prior to seeing the provider. Please note that any paperwork FMLA, Disability or other documents regarding health condition is subject to a $25.00 charge that must be received prior to completion of paperwork in the form of a money order or check.     If you need a refill on your cardiac medications before your next appointment, please call your pharmacy.     Locations to Purchase Compression Stockings:  [facility] [address] [place]. [place], [place] [address] [phone] (fax) [email or web address] *Mon-Fri 9am-4:30pm* *Closed on Holidays*  Putting on Compression Stockings Turn the stocking inside-out, then fit it over your toes and heel.  Roll the stocking up your leg.  Once stockings are on, make sure the top of the stocking is about two fingers' width below the crease of the knee (or the groin if you wear thigh-high stockings).  Use equipment, such as a stocking donner, or wear rubber gloves to make it easier to put on compression stockings.   Elastic compression stockings are prescribed to treat many vein problems. Wearing them may be the most important thing you do to manage your symptoms. The stockings fit tightly aroundyour ankle, gradually reducing in pressure as they go up your legs. This helps keep blood flowing toyour heart. As a result, swelling is reduced. Your doctor will prescribe stockings at a safe pressure for you. He or she will also tell you how often to  wear and remove the stockings. Follow these instructions closely.Also, do not buy or wear compression stockings without first seeing your doctor. Tips for Wear and Care To wear stockings safely and to get the most benefit:  Wear the length prescribed by your doctor.  Pull them to the designated height and no farther. Don't let them bunch at the top. This can restrict blood flow and increase swelling.  Wear the stockingsfor the amount of time your doctor recommends. Replace them when they start to feel loose. This will likely be every 3 to 6 months.  Remove them as your doctor directs. When removed, wash your legs. Then check your legs and feet for sores. Call your doctor if you find a sore. Don't put the stockings back on unless your doctor directs.  Wash the stockings as instructed. They may need to be handwashed.  You should use an abdominal binder

## 2017-05-02 ENCOUNTER — Telehealth: Payer: Self-pay | Admitting: Cardiology

## 2017-05-02 NOTE — Telephone Encounter (Signed)
Has questions about medication interactions

## 2017-05-02 NOTE — Telephone Encounter (Signed)
Pts son notified to stop rasagiline and to take midodrine per Dr [name].  Noted on Rx sent from pharmacy.  Pts son verbalized understanding.

## 2017-05-10 ENCOUNTER — Telehealth: Payer: Self-pay

## 2017-05-10 NOTE — Telephone Encounter (Signed)
Pts daughter Freida Busman calling stating that pts BP is running 148/89 while standing, but drops to 105/74 when he stands.  She states that it goes back into the 140's/80's after he stands back up, and she is concerned with whether or not he should continue taking midodrine.  Dr Agustin Cree reviewed pts medications, and his BP readings and advised to continue current medications as planned.  Pts daughter was advised to not take midodrine and lay down.  Pts daughter to advised to continue to monitor BP, and call next week with any other BP issues.  Pts daughter verbalized understanding.

## 2017-05-27 ENCOUNTER — Other Ambulatory Visit: Payer: Self-pay | Admitting: *Deleted

## 2017-05-27 NOTE — Progress Notes (Signed)
Cardiology Office Note:    Date:  05/28/2017   ID:  [name], DOB [birth year], MRN [id number]  PCP:  Charletta Cousin, MD (Inactive)  Cardiologist:  [name], MD    Referring MD: Melony Overly, MD    ASSESSMENT:    1. Hypotension, unspecified hypotension type   2. Pacemaker lead malfunction, subsequent encounter    PLAN:    In order of problems listed above:  1. I asked him to add Florinef and increase his alpha agonist Midodrine and start to use an abdominal binder to mitigate his autonomic dysfunction and hypotension.  The family continue to follow with sitting and standing blood pressures daily. 2. Referral to EP within my practice, the patient and family are upset that he has atrial lead function is 100% paced  ventricular and I suspect that this is potentiating his hypotension.   Next appointment: 4 weeks   Medication Adjustments/Labs and Tests Ordered: Current medicines are reviewed at length with the patient today.  Concerns regarding medicines are outlined above.  Orders Placed This Encounter  Procedures  . Ambulatory referral to Cardiac Electrophysiology   Meds ordered this encounter  Medications  . fludrocortisone (FLORINEF) 0.1 MG tablet    Sig: Take 1 tablet (0.1 mg total) by mouth daily.    Dispense:  30 tablet    Refill:  3    Chief Complaint  Patient presents with  . Follow-up    1 month  . Hypotension    History of Present Illness:    Kenneth Macias is a 83 y.o. 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 He is not using an abdominal binder. Compliance with diet, lifestyle and medications: Yes Past Medical History:  Diagnosis Date  . Adenocarcinoma of colon ([place]) 01/03/2017    . Alzheimer's disease 01/06/2016  . Atrial flutter ([place]) 12/18/2016  . Chest pain 05/29/2015  . Chronic kidney disease 05/31/2015  . Coronary artery disease   . Coronary artery disease involving native coronary artery    Overview:  Overview:  1. PCI and BMS to RCA 2011 2. PCI and DES to RCA 07/30/11  Cardiac cath 05/31/15: Cath 05/31/15 Conclusion  1. Mid RCA to Dist RCA lesion, 20% stenosed. The lesion was previously treated with a stent (unknown type). 2. Prox RCA lesion, 20% stenosed. 3. Mid RCA lesion, 20% stenosed. 4. 1st RPLB lesion, 99% stenosed. 5. Ost RPDA lesion, 60% stenosed. 6. LM lesion, 40% stenosed. 7. Ost Cx to Prox Cx lesion, 40% stenosed. 8. 2nd Mrg lesion, 60% stenosed. 9. Ost LAD to Mid LAD lesion, 30% stenosed. 10. Mid LAD to Dist LAD lesion, 60% stenosed. 11. Ost 2nd Diag to 2nd Diag lesion, 99% stenosed. 12. Ost 1st Diag to 1st Diag lesion, 99% stenosed.  1. Unstable angina 2. The right coronary artery is diffusely calcified. There is mild disease in the proximal and mid segments. The distal stent is patent. Beyond the stent the posterolateral branches are small in caliber and diffusely diseased. These vessels are too small for PCI.  3. The Circumflex terminates into an obtuse margi  . Diabetes mellitus without complication ([place])   . DM (diabetes mellitus) ([place]) 05/31/2015  . Fall 01/06/2016  . HTN (hypertension) 05/31/2015  . Hyperlipidemia  05/31/2015  . Hypertension   . Hypertensive kidney disease with stage 3 chronic kidney disease ([place]) 04/25/2015  . Laceration of scalp 01/06/2016   Overview:  Overview:  Fall  . Leukocytosis 05/11/2016  . Myocardial infarction ([place]) 07/30/2014   Overview:  x2  . Neutrophilic leukocytosis 51/76/1607  . Normal left ventricular systolic function and wall motion 06/25/2016  . NSTEMI (non-ST elevated myocardial infarction) ([place]) 05/31/2015  . Orthostatic hypotension 05/11/2016  . Pacemaker reprogramming/check 07/10/2016  . Parkinson disease ([place])  05/31/2015  . Parkinson's disease ([place])   . Paroxysmal atrial flutter ([place]) 06/11/2016  . Subarachnoid hemorrhage ([place]) 01/06/2016  . Syncope 01/09/2016  . Type 2 diabetes mellitus without complication, without long-term current use of insulin ([place]) 06/10/2016  . Unstable angina pectoris ([place]) 05/31/2015    Past Surgical History:  Procedure Laterality Date  . CARDIAC CATHETERIZATION N/A 05/31/2015   Procedure: Left Heart Cath and Coronary Angiography;  Surgeon: Burnell Blanks, MD;  Location: [facility];  Service: Cardiovascular;  Laterality: N/A;  . COLONOSCOPY    . CORONARY ANGIOPLASTY WITH STENT PLACEMENT    . HAND SURGERY      Current Medications: Current Meds  Medication Sig  . Carbidopa-Levodopa ER 61.25-245 MG CPCR Take 61.25-245 mg by mouth 3 (three) times daily.  . cholecalciferol (VITAMIN D) 1000 UNITS tablet Take 1,000 Units by mouth daily.  . entacapone (COMTAN) 200 MG tablet Take 200 mg by mouth 3 (three) times daily.   Marland Kitchen FLUoxetine (PROZAC) 10 MG tablet Take 10 mg by mouth daily.  . isosorbide mononitrate (IMDUR) 30 MG 24 hr tablet Take 1 tablet (30 mg total) by mouth daily.  . midodrine (PROAMATINE) 2.5 MG tablet Take 5 mg by mouth 3 (three) times daily.  . pantoprazole (PROTONIX) 40 MG tablet Take 40 mg by mouth daily.  . ranolazine (RANEXA) 1000 MG SR tablet Take 1,000 mg by mouth 2 (two) times daily.  . saxagliptin HCl (ONGLYZA) 5 MG TABS tablet Take 5 mg by mouth daily.     Allergies:   Patient has no known allergies.   Social History   Social History  . Marital status: Widowed    Spouse name: N/A  . Number of children: N/A  . Years of education: N/A   Social History Main Topics  . Smoking status: Never Smoker  . Smokeless tobacco: Current User    Types: Chew  . Alcohol use No  . Drug use: No  . Sexual activity: Not Asked   Other Topics Concern  . None   Social History Narrative  . None     Family History: The patient's family  history includes CAD in his daughter and son; Cancer in his mother; Diabetes in his father; Heart attack in his father; Heart disease in his daughter and father. ROS:   Please see the history of present illness.    All other systems reviewed and are negative.  EKGs/Labs/Other Studies Reviewed:    The following studies were reviewed today: Cardiac Testing Data:  Device Information:  Battery BOL DDIR 60-130 ppm NO P waves No capture at 8V unipolar and Bipolar. Far-field P waves can be seen on the Leadless EGM.  No sensing or pacing of the atrial - only far-field sensing. Stable RV thresholds and sensing HR profile is appropriate in the RV, but the Atrial rate profile is unreliable  Recent Labs: No results found for requested labs within last 8760 hours.  Recent Lipid Panel    Component  Value Date/Time   CHOL 198 05/31/2015 0842   TRIG 136 05/31/2015 0842   HDL 38 (L) 05/31/2015 0842   CHOLHDL 5.2 05/31/2015 0842   VLDL 27 05/31/2015 0842   LDLCALC 133 (H) 05/31/2015 0842    Physical Exam:    VS:  BP 110/60 (BP Location: Right Arm, Patient Position: Sitting, Cuff Size: Normal)   Pulse 60   Ht 5\' 11"  (1.803 m)   Wt 178 lb (80.7 kg)   SpO2 98%   BMI 24.83 kg/m     Wt Readings from Last 3 Encounters:  05/28/17 178 lb (80.7 kg)  04/30/17 182 lb (82.6 kg)  03/13/17 194 lb 1.9 oz (88.1 kg)     GEN:  Well nourished, well developed in no acute distress HEENT: Normal NECK: No JVD; No carotid bruits LYMPHATICS: No lymphadenopathy CARDIAC: RRR, no murmurs, rubs, gallops RESPIRATORY:  Clear to auscultation without rales, wheezing or rhonchi  ABDOMEN: Soft, non-tender, non-distended MUSCULOSKELETAL:  No edema; No deformity  SKIN: Warm and dry NEUROLOGIC:  Alert and oriented x 3 PSYCHIATRIC:  Normal affect    Signed, [name], MD  05/28/2017 3:05 PM    [facility]

## 2017-05-28 ENCOUNTER — Ambulatory Visit (INDEPENDENT_AMBULATORY_CARE_PROVIDER_SITE_OTHER): Payer: Medicare Other | Admitting: Cardiology

## 2017-05-28 ENCOUNTER — Encounter: Payer: Self-pay | Admitting: Cardiology

## 2017-05-28 VITALS — BP 110/60 | HR 60 | Ht 71.0 in | Wt 178.0 lb

## 2017-05-28 DIAGNOSIS — T82110D Breakdown (mechanical) of cardiac electrode, subsequent encounter: Secondary | ICD-10-CM | POA: Diagnosis not present

## 2017-05-28 DIAGNOSIS — T82110A Breakdown (mechanical) of cardiac electrode, initial encounter: Secondary | ICD-10-CM | POA: Insufficient documentation

## 2017-05-28 DIAGNOSIS — I959 Hypotension, unspecified: Secondary | ICD-10-CM | POA: Diagnosis not present

## 2017-05-28 MED ORDER — FLUDROCORTISONE ACETATE 0.1 MG PO TABS: 0.1000 mg | ORAL_TABLET | Freq: Every day | ORAL | 3 refills | Status: DC

## 2017-05-28 MED ORDER — MIDODRINE HCL 5 MG PO TABS: 5.0000 mg | ORAL_TABLET | Freq: Three times a day (TID) | ORAL | 3 refills | Status: DC

## 2017-05-28 NOTE — Patient Instructions (Addendum)
Medication Instructions:  Your physician has recommended you make the following change in your medication:  START fludrocortisone (Florenef) 0.1 mg daily INCREASE midodrine 5 mg three times daily  Labwork: None  Testing/Procedures: None  Follow-Up: Your physician recommends that you schedule a follow-up appointment in: 4 weeks.  You will receive a phone call to schedule with Dr. Camnitz-electrophysiology.  Any Other Special Instructions Will Be Listed Below (If Applicable).     If you need a refill on your cardiac medications before your next appointment, please call your pharmacy.    Start to use an abdominal binder

## 2017-05-29 ENCOUNTER — Telehealth: Payer: Self-pay | Admitting: Cardiology

## 2017-05-29 ENCOUNTER — Other Ambulatory Visit: Payer: Self-pay

## 2017-05-29 DIAGNOSIS — G20 Parkinson's disease: Secondary | ICD-10-CM

## 2017-05-29 MED ORDER — MIDODRINE HCL 10 MG PO TABS: 10.0000 mg | ORAL_TABLET | Freq: Three times a day (TID) | ORAL | 3 refills | Status: DC

## 2017-05-29 NOTE — Telephone Encounter (Signed)
yes

## 2017-05-29 NOTE — Telephone Encounter (Signed)
Advised to increase to 10 mg three times daily. Advised Rx sent to [facility] in [place] with new prescription. Verbalized understanding, no further questions.

## 2017-05-29 NOTE — Telephone Encounter (Signed)
Has questions about a medication he was given yesterday

## 2017-05-29 NOTE — Telephone Encounter (Signed)
Patient was advised to increase midodrine to 5 mg three times daily at office visit yesterday. This change was made at the previous appointment on 04/30/17. Patient's questioning did the prescription need to be doubled to 10 mg three times daily. Please advise.

## 2017-05-30 ENCOUNTER — Encounter: Payer: Self-pay | Admitting: Neurology

## 2017-05-31 ENCOUNTER — Ambulatory Visit: Payer: Medicare Other | Admitting: Cardiology

## 2017-06-06 ENCOUNTER — Telehealth: Payer: Self-pay | Admitting: Cardiology

## 2017-06-06 NOTE — Telephone Encounter (Signed)
Sitting: 214/89, stand: 126/69, 130/70  Last night sit 163/86, stand 120/78  Would like to know if can decrease midodrine back to 5 mg three times daily.

## 2017-06-06 NOTE — Telephone Encounter (Signed)
Kenneth Macias advised to decrease midodrine dose to 10 mg in the morning, 5 mg around lunch, and 5 mg in the evening. Kenneth Macias verbalized understanding and will continue to monitor blood pressure. No further questions.

## 2017-06-06 NOTE — Telephone Encounter (Signed)
Lets try 10-5-5, unfortunately to have a good BP standing his BP will be elevated.. I think that it is most important to be able to stand and avoid injuries.

## 2017-06-06 NOTE — Telephone Encounter (Signed)
Left message to return call 

## 2017-06-06 NOTE — Telephone Encounter (Signed)
His BP has been running way high but was pretty good this morning

## 2017-06-10 ENCOUNTER — Telehealth: Payer: Self-pay | Admitting: Cardiology

## 2017-06-10 NOTE — Telephone Encounter (Signed)
Confirmed appointment is with Dr. Curt Bears in the [facility] location, the same office as Dr. [name]. Candy verbalized understanding.

## 2017-06-10 NOTE — Telephone Encounter (Signed)
Wants to know which doctor he was referred to and where they are

## 2017-06-12 ENCOUNTER — Ambulatory Visit: Payer: Medicare Other | Admitting: Cardiology

## 2017-06-18 NOTE — Progress Notes (Signed)
Kenneth Macias was seen today in the movement disorders clinic for neurologic consultation at the request of Kenneth Macias, Kenneth Cork, MD.  The consultation is for the evaluation of PD.  Pt previously under the care of Dr. Verdene Macias and Dr. Marijean Macias and I have reviewed those records. This patient is accompanied in the office by his daughter who supplements the history.  Pt was dx with PD about 7 years ago (about 2011).  His first sx was trouble walking; pt didn't even notice it but Dr. Bettina Macias noted it.  His first medication was carbidopa/levodopa.  He was on carbidopa/levodopa 25/250, 2 po bid and azilect.  He began to have orthostasis and syncope at the end of 2017.  At that time his carbidopa/levodopa 25/250 was changed to rytary 245 mg, 2 po tid. it appears via records that he had hallucinations, falls and more episodes of REM behavior disorder with this and it was decreased to 1 tablet 3 times per day (7:30am/3:30/bedtime).  He did better with this.  He takes comtan with this. However, orthostasis continued.  He was started on Northera at low dose, 100 mg 3 times per day in November, 2017.  They are unsure of why that was d/c.  In fact they aren't even sure that he was ever on that one.  The patient is currently on both Florinef and midodrine, which are managed by cardiology.  He also had a pacemaker implanted.  In the midst of all this, the patient was diagnosed with adenocarcinoma of the colon.  He had a resection and is now being managed by medical oncology.     Specific Symptoms:  Tremor: Yes.  , L and R hand occasionally but rarely in the legs Family hx of similar:  No. Voice: gotten softer Sleep: trouble staying asleep  Vivid Dreams:  Yes.  , occasional nightmares  Acting out dreams:  No. per patient but yes per daughter Wet Pillows: Yes.   Postural symptoms:  Yes.    Falls?  Yes.  , one time about 3 weeks.  Currently has PT with home health.   Bradykinesia symptoms: slow movements, difficulty getting out  of a chair and difficulty regaining balance; was WC bound but now uses walker with PT Loss of smell:  No. Loss of taste:  No. Urinary Incontinence:  Yes.   Difficulty Swallowing:  No., but has had esophagus stretched Handwriting, micrographia: Yes.   Trouble with ADL's:  Yes.   (gets help with bathing)  Trouble buttoning clothing: Yes.   Depression:  No. - daughter states that prozac not helpful and was given after MI few years ago to help sleep Memory changes:  Yes.   (short term issues and daughter describes "sun downing"; lives with one daughter and daughter with him comes during the day and stays with him and son lives next door) Hallucinations:  Yes.   (able to identify not real; doesn't happen daily but will see wife who has been deceased for long time)  visual distortions: Yes.   N/V:  No. Lightheaded:  Yes.   but markedly better now that on florinef and midodrine Diplopia:  No. but vision poor because of cataracts Dyskinesia:  No.   PREVIOUS MEDICATIONS: Sinemet and Comtan; ? northera  ALLERGIES:  No Known Allergies  CURRENT MEDICATIONS:  Outpatient Encounter Medications as of 06/25/2017  Medication Sig  . Carbidopa-Levodopa ER 61.25-245 MG CPCR Take 61.25-245 mg by mouth 3 (three) times daily.  . cholecalciferol (VITAMIN D) 1000 UNITS  tablet Take 1,000 Units by mouth daily.  . entacapone (COMTAN) 200 MG tablet Take 200 mg by mouth 3 (three) times daily.   . fludrocortisone (FLORINEF) 0.1 MG tablet Take 1 tablet (0.1 mg total) by mouth daily.  Marland Kitchen FLUoxetine (PROZAC) 10 MG tablet Take 10 mg by mouth daily.  . isosorbide mononitrate (IMDUR) 30 MG 24 hr tablet Take 1 tablet (30 mg total) by mouth daily.  . midodrine (PROAMATINE) 10 MG tablet Take 1 tablet (10 mg total) by mouth 3 (three) times daily. (Patient taking differently: Take 10 mg by mouth 3 (three) times daily. Take 10 mg am, 5 mg noon, 5 mg evening)  . pantoprazole (PROTONIX) 40 MG tablet Take 40 mg by mouth daily.  .  ranolazine (RANEXA) 1000 MG SR tablet Take 1 tablet (1,000 mg total) by mouth 2 (two) times daily.  . saxagliptin HCl (ONGLYZA) 5 MG TABS tablet Take 5 mg by mouth daily.  . QUEtiapine (SEROQUEL) 25 MG tablet Take 1 tablet (25 mg total) by mouth at bedtime.  . [DISCONTINUED] ranolazine (RANEXA) 1000 MG SR tablet Take 1,000 mg by mouth 2 (two) times daily.   No facility-administered encounter medications on file as of 06/25/2017.     PAST MEDICAL HISTORY:   Past Medical History:  Diagnosis Date  . Adenocarcinoma of colon ([place]) 01/03/2017  . Alzheimer's disease 01/06/2016  . Atrial flutter ([place]) 12/18/2016  . Chest pain 05/29/2015  . Chronic kidney disease 05/31/2015  . Coronary artery disease   . Coronary artery disease involving native coronary artery    Overview:  Overview:  1. PCI and BMS to RCA 2011 2. PCI and DES to RCA 07/30/11  Cardiac cath 05/31/15: Cath 05/31/15 Conclusion  1. Mid RCA to Dist RCA lesion, 20% stenosed. The lesion was previously treated with a stent (unknown type). 2. Prox RCA lesion, 20% stenosed. 3. Mid RCA lesion, 20% stenosed. 4. 1st RPLB lesion, 99% stenosed. 5. Ost RPDA lesion, 60% stenosed. 6. LM lesion, 40% stenosed. 7. Ost Cx to Prox Cx lesion, 40% stenosed. 8. 2nd Mrg lesion, 60% stenosed. 9. Ost LAD to Mid LAD lesion, 30% stenosed. 10. Mid LAD to Dist LAD lesion, 60% stenosed. 11. Ost 2nd Diag to 2nd Diag lesion, 99% stenosed. 12. Ost 1st Diag to 1st Diag lesion, 99% stenosed.  1. Unstable angina 2. The right coronary artery is diffusely calcified. There is mild disease in the proximal and mid segments. The distal stent is patent. Beyond the stent the posterolateral branches are small in caliber and diffusely diseased. These vessels are too small for PCI.  3. The Circumflex terminates into an obtuse margi  . Diabetes mellitus without complication ([place])   . DM (diabetes mellitus) ([place]) 05/31/2015  . Fall 01/06/2016  . HTN (hypertension) 05/31/2015  . Hyperlipidemia  05/31/2015  . Hypertension   . Hypertensive kidney disease with stage 3 chronic kidney disease ([place]) 04/25/2015  . Laceration of scalp 01/06/2016   Overview:  Overview:  Fall  . Leukocytosis 05/11/2016  . Myocardial infarction ([place]) 07/30/2014   Overview:  x2  . Neutrophilic leukocytosis 82/42/3536  . Normal left ventricular systolic function and wall motion 06/25/2016  . NSTEMI (non-ST elevated myocardial infarction) ([place]) 05/31/2015  . Orthostatic hypotension 05/11/2016  . Pacemaker reprogramming/check 07/10/2016  . Parkinson disease ([place]) 05/31/2015  . Paroxysmal atrial flutter ([place]) 06/11/2016  . Subarachnoid hemorrhage ([place]) 01/06/2016  . Syncope 01/09/2016  . Type 2 diabetes mellitus without complication, without long-term current use of insulin ([place])  06/10/2016  . Unstable angina pectoris ([place]) 05/31/2015    PAST SURGICAL HISTORY:   Past Surgical History:  Procedure Laterality Date  . CARDIAC CATHETERIZATION N/A 05/31/2015   Procedure: Left Heart Cath and Coronary Angiography;  Surgeon: Burnell Blanks, MD;  Location: [facility];  Service: Cardiovascular;  Laterality: N/A;  . COLON RESECTION    . COLONOSCOPY    . CORONARY ANGIOPLASTY WITH STENT PLACEMENT    . HAND SURGERY      SOCIAL HISTORY:   Social History   Socioeconomic History  . Marital status: Widowed    Spouse name: Not on file  . Number of children: 4  . Years of education: 45  . Highest education level: Not on file  Social Needs  . Financial resource strain: Not on file  . Food insecurity - worry: Not on file  . Food insecurity - inability: Not on file  . Transportation needs - medical: Not on file  . Transportation needs - non-medical: Not on file  Occupational History  . 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 Smoking status: Never Smoker  . Smokeless tobacco: Current User    Types: Chew  Substance and Sexual Activity  . Alcohol use: No  . Drug  use: No  . Sexual activity: Not on file  Other Topics Concern  . Not on file  Social History Narrative   Patient lives with one of his daughters in a one story home.  Has 4 children.  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  Education: high school.     FAMILY HISTORY:   Family Status  Relation Name Status  . Mother  Deceased  . Father  Deceased  . Son  Alive  . Daughter 3 Alive  . Sister  Alive    ROS:  A complete 10 system review of systems was obtained and was unremarkable apart from what is mentioned above.  PHYSICAL EXAMINATION:    VITALS:   Vitals:   06/25/17 0821  BP: 124/70  Pulse: 67  SpO2: 94%  Weight: 187 lb (84.8 kg)  Height: _0  (1.803 m)    GEN:  The patient appears stated age and is in NAD. HEENT:  Normocephalic, atraumatic.  The mucous membranes are moist. The superficial temporal arteries are without ropiness or tenderness. CV:  RRR Lungs:  CTAB Neck/HEME:  There are no carotid bruits bilaterally.  Neurological examination:  Orientation: The patient is alert and oriented x3.  Remote memory intact.  Some trouble with recent memory.  Able to relay most of medical hx but not meds. Cranial nerves: There is good facial symmetry.  There is blepharospasm.   Pupils are equal round and reactive to light bilaterally. Fundoscopic exam is attempted but the disc margins are not well visualized bilaterally.  Extraocular muscles are intact. The visual fields are full to confrontational testing. The speech is fluent and clear.  He is hypophonic.  Soft palate rises symmetrically and there is no tongue deviation. Hearing is intact to conversational tone. Sensation: Sensation is intact to light and pinprick throughout (facial, trunk, extremities). Vibration is intact at the bilateral big toe. There is no extinction with double simultaneous stimulation. There is no sensory dermatomal level identified. Motor: Strength is 5/5 in the bilateral upper and lower  extremities.   Shoulder shrug is equal and symmetric.  There is no pronator drift. Deep tendon reflexes: Deep tendon reflexes are  1/4 at the bilateral biceps, triceps, brachioradialis, patella and achilles. Plantar responses are downgoing bilaterally.  Movement examination: Tone: There is normal tone in the bilateral upper extremities.  The tone in the lower extremities is normal.  Abnormal movements: There is no tremor noted.  There is blepharospasm. Coordination:  There is no significant decremation with RAM's, but he does have rather significant apraxia with most rapid alternating movements in the upper extremities bilaterally. Gait and Station: The patient has difficulty arising out of a deep-seated chair without the use of the hands.  He is given a gait belt.  He pushes off of the chair and requires minor assistance to get up.  He is given a walker.  With a walker, stride length is actually quite good.  Without the walker, he festinate's.   ASSESSMENT/PLAN:  1.  Parkinsons disease, dx in approximate 2011.  His Parkinson's disease has been complicated by severe orthostasis, memory change, hallucinations.  -We discussed the diagnosis as well as pathophysiology of the disease.  We discussed treatment options as well as prognostic indicators.  Patient education was provided.  -We discussed that it used to be thought that levodopa would increase risk of melanoma but now it is believed that Parkinsons itself likely increases risk of melanoma. he is to get regular skin checks.  -Greater than 50% of the 60 minute visit was spent in counseling answering questions and talking about what to expect now as well as in the future.  We talked about medication options as well as potential future surgical options.  We talked about safety in the home.  -We decided to move his rytary from 245 mg at 7 AM/3:30 PM/bedtime to 7 AM/11 AM/4 PM.   risks, benefits, side effects and alternative therapies were discussed.  The  opportunity to ask questions was given and they were answered to the best of my ability.  The patient expressed understanding and willingness to follow the outlined treatment protocols.  -Patient will continue his Comtan, 200 mg, 3 times a day with Rytary.  I may discontinue that in the future for ease of dosing.  I am not sure that he really needs that with the right tarry.  -Patient has shown marked improvement with physical therapy and ability to walk.  Prior to that he was wheelchair-bound and actually looked pretty good with walking today.  I would like to see him engage in some type of community exercise program.  He lives close to the [employer] steady boxing for Parkinson's.  His daughter is very engaged with him and could help him get there.  -We discussed community resources in the area including patient support groups.  He met with my Parkinson's social worker today.  -Hallucinations have become problematic.  We discussed Nuplazid and Seroquel.  We discussed extensively the black box warning on these medications.  Ultimately, his daughter decided that Seroquel may be a better option given the fact that it could help sleep, which is a big issue for him.  Risks, benefits, and side effects were discussed and understood.  2.  Blepharospasm  -This is not bothersome for him.  No treatment was recommended.  3.  Insomnia  -As above, we discussed various medication options.  We ultimately decided on Seroquel.  We had discussed mirtazapine as well.  We did decide to discontinue his Prozac.  Neither patient nor his daughter thought that depression was an issue and stated that the Prozac was originally for sleep.  4.  Adenocarcinoma of  the colon  -Status post resection.  Now being managed by oncology.  5.  Orthostatic hypotension  -on midodrine and florinef.  Discussed concept of permissive hypertension.  Would not recommend treating high blood pressure unless systolic blood pressure consistently  in the 190s-200s.  Patients daughter states that he was told the same by his cardiologist.  6.  Follow up is anticipated in the next few months, sooner should new neurologic issues arise.  Above recorded time did not include the 40 min of record review which was detailed above, which was non face to face time.   Cc:  Charletta Cousin, MD (Inactive)

## 2017-06-19 ENCOUNTER — Ambulatory Visit (INDEPENDENT_AMBULATORY_CARE_PROVIDER_SITE_OTHER): Payer: Medicare Other | Admitting: Cardiology

## 2017-06-19 ENCOUNTER — Ambulatory Visit (HOSPITAL_BASED_OUTPATIENT_CLINIC_OR_DEPARTMENT_OTHER)
Admission: RE | Admit: 2017-06-19 | Discharge: 2017-06-19 | Disposition: A | Discharge status: Home / Self Care | Payer: Medicare Other | Source: Ambulatory Visit | Attending: Cardiology | Admitting: Cardiology

## 2017-06-19 ENCOUNTER — Encounter: Payer: Self-pay | Admitting: Cardiology

## 2017-06-19 VITALS — BP 134/71 | HR 63 | Ht 71.0 in | Wt 185.1 lb

## 2017-06-19 DIAGNOSIS — T82120A Displacement of cardiac electrode, initial encounter: Secondary | ICD-10-CM

## 2017-06-19 DIAGNOSIS — I495 Sick sinus syndrome: Secondary | ICD-10-CM | POA: Diagnosis not present

## 2017-06-19 DIAGNOSIS — I25118 Atherosclerotic heart disease of native coronary artery with other forms of angina pectoris: Secondary | ICD-10-CM | POA: Diagnosis not present

## 2017-06-19 DIAGNOSIS — Y831 Surgical operation with implant of artificial internal device as the cause of abnormal reaction of the patient, or of later complication, without mention of misadventure at the time of the procedure: Secondary | ICD-10-CM | POA: Diagnosis not present

## 2017-06-19 DIAGNOSIS — I700 Atherosclerosis of aorta: Secondary | ICD-10-CM | POA: Insufficient documentation

## 2017-06-19 NOTE — Patient Instructions (Signed)
Medication Instructions:  Your physician recommends that you continue on your current medications as directed. Please refer to the Current Medication list given to you today.  * If you need a refill on your cardiac medications before your next appointment, please call your pharmacy. *  Labwork: None ordered  Testing/Procedures: A chest x-ray takes a picture of the organs and structures inside the chest, including the heart, lungs, and blood vessels. This test can show several things, including, whether the heart is enlarges; whether fluid is building up in the lungs; and whether pacemaker / defibrillator leads are still in place.  Follow-Up: To be determined after physician reviews your chest xray  Thank you for choosing [facility]!!   Trinidad Curet, RN [phone]

## 2017-06-19 NOTE — Progress Notes (Signed)
Electrophysiology Office Note   Date:  06/19/2017   ID:  Kenneth Macias, DOB 11-23-1933, MRN [id number]  PCP:  Charletta Cousin, MD (Inactive)  Cardiologist:  [name] Primary Electrophysiologist:  Lesslie Mossa Meredith Leeds, MD    Chief Complaint  Patient presents with  . Pacemaker Check    Syncope/PAF     History of Present Illness: Kenneth Macias is a 83 y.o. male who is being seen today for the evaluation of pacemaker lead malfunction at the request of [name]. Presenting today for electrophysiology evaluation.  He has a history of severe orthostatic hypotension, coronary artery disease, atrial flutter, previous traumatic intracranial hemorrhage, hypertension, Parkinson's disease, and a dual-chamber pacemaker for bradycardia and syncope.  He was found to have atrial lead malfunction and was started on Midrin due to hypotension.  Today, he denies symptoms of palpitations, chest pain, shortness of breath, orthopnea, PND, lower extremity edema, claudication, dizziness, presyncope, syncope, bleeding, or neurologic sequela. The patient is tolerating medications without difficulties.  Interrogation of his pacemaker shows ventricular capture at 8 mV on the atrial lead.  He is currently V pacing 99% of the time.  He currently feels at his baseline.   Past Medical History:  Diagnosis Date  . Adenocarcinoma of colon ([place]) 01/03/2017  . Alzheimer's disease 01/06/2016  . Atrial flutter ([place]) 12/18/2016  . Chest pain 05/29/2015  . Chronic kidney disease 05/31/2015  . Coronary artery disease   . Coronary artery disease involving native coronary artery    Overview:  Overview:  1. PCI and BMS to RCA 2011 2. PCI and DES to RCA 07/30/11  Cardiac cath 05/31/15: Cath 05/31/15 Conclusion  1. Mid RCA to Dist RCA lesion, 20% stenosed. The lesion was previously treated with a stent (unknown type). 2. Prox RCA lesion, 20% stenosed. 3. Mid RCA lesion, 20% stenosed. 4. 1st RPLB lesion, 99% stenosed. 5. Ost RPDA lesion, 60%  stenosed. 6. LM lesion, 40% stenosed. 7. Ost Cx to Prox Cx lesion, 40% stenosed. 8. 2nd Mrg lesion, 60% stenosed. 9. Ost LAD to Mid LAD lesion, 30% stenosed. 10. Mid LAD to Dist LAD lesion, 60% stenosed. 11. Ost 2nd Diag to 2nd Diag lesion, 99% stenosed. 12. Ost 1st Diag to 1st Diag lesion, 99% stenosed.  1. Unstable angina 2. The right coronary artery is diffusely calcified. There is mild disease in the proximal and mid segments. The distal stent is patent. Beyond the stent the posterolateral branches are small in caliber and diffusely diseased. These vessels are too small for PCI.  3. The Circumflex terminates into an obtuse margi  . Diabetes mellitus without complication ([place])   . DM (diabetes mellitus) ([place]) 05/31/2015  . Fall 01/06/2016  . HTN (hypertension) 05/31/2015  . Hyperlipidemia 05/31/2015  . Hypertension   . Hypertensive kidney disease with stage 3 chronic kidney disease ([place]) 04/25/2015  . Laceration of scalp 01/06/2016   Overview:  Overview:  Fall  . Leukocytosis 05/11/2016  . Myocardial infarction ([place]) 07/30/2014   Overview:  x2  . Neutrophilic leukocytosis 05/23/8526  . Normal left ventricular systolic function and wall motion 06/25/2016  . NSTEMI (non-ST elevated myocardial infarction) ([place]) 05/31/2015  . Orthostatic hypotension 05/11/2016  . Pacemaker reprogramming/check 07/10/2016  . Parkinson disease ([place]) 05/31/2015  . Parkinson's disease ([place])   . Paroxysmal atrial flutter ([place]) 06/11/2016  . Subarachnoid hemorrhage ([place]) 01/06/2016  . Syncope 01/09/2016  . Type 2 diabetes mellitus without complication, without long-term current use of insulin ([place]) 06/10/2016  . Unstable angina pectoris ([place])  05/31/2015   Past Surgical History:  Procedure Laterality Date  . CARDIAC CATHETERIZATION N/A 05/31/2015   Procedure: Left Heart Cath and Coronary Angiography;  Surgeon: Burnell Blanks, MD;  Location: [facility];  Service: Cardiovascular;  Laterality: N/A;  . COLONOSCOPY      . CORONARY ANGIOPLASTY WITH STENT PLACEMENT    . HAND SURGERY       Current Outpatient Medications  Medication Sig Dispense Refill  . Carbidopa-Levodopa ER 61.25-245 MG CPCR Take 61.25-245 mg by mouth 3 (three) times daily.    . cholecalciferol (VITAMIN D) 1000 UNITS tablet Take 1,000 Units by mouth daily.    . entacapone (COMTAN) 200 MG tablet Take 200 mg by mouth 3 (three) times daily.     . fludrocortisone (FLORINEF) 0.1 MG tablet Take 1 tablet (0.1 mg total) by mouth daily. 30 tablet 3  . FLUoxetine (PROZAC) 10 MG tablet Take 10 mg by mouth daily.    . isosorbide mononitrate (IMDUR) 30 MG 24 hr tablet Take 1 tablet (30 mg total) by mouth daily. 30 tablet 6  . midodrine (PROAMATINE) 10 MG tablet Take 1 tablet (10 mg total) by mouth 3 (three) times daily. 90 tablet 3  . pantoprazole (PROTONIX) 40 MG tablet Take 40 mg by mouth daily.    . saxagliptin HCl (ONGLYZA) 5 MG TABS tablet Take 5 mg by mouth daily.    . ranolazine (RANEXA) 1000 MG SR tablet Take 1,000 mg by mouth 2 (two) times daily.     No current facility-administered medications for this visit.     Allergies:   Patient has no known allergies.   Social History:  The patient  reports that  has never smoked. His smokeless tobacco use includes chew. He reports that he does not drink alcohol or use drugs.   Family History:  The patient's family history includes CAD in his daughter and son; Cancer in his mother; Diabetes in his father; Heart attack in his father; Heart disease in his daughter and father.    ROS:  Please see the history of present illness.   Otherwise, review of systems is positive for none.   All other systems are reviewed and negative.    PHYSICAL EXAM: VS:  BP 134/71   Pulse 63   Ht 5\' 11"  (1.803 m)   Wt 185 lb 1.9 oz (84 kg)   BMI 25.82 kg/m  , BMI Body mass index is 25.82 kg/m. GEN: Well nourished, well developed, in no acute distress  HEENT: normal  Neck: no JVD, carotid bruits, or  masses Cardiac: RRR; no murmurs, rubs, or gallops,no edema  Respiratory:  clear to auscultation bilaterally, normal work of breathing GI: soft, nontender, nondistended, + BS MS: no deformity or atrophy  Skin: warm and dry, device pocket is well healed Neuro:  Strength and sensation are intact Psych: euthymic mood, full affect  EKG:  EKG is not ordered today. Personal review of the ekg ordered 8/15/18shows ventricular pacing  Device interrogation is reviewed today in detail.  See PaceArt for details.   Recent Labs: No results found for requested labs within last 8760 hours.    Lipid Panel     Component Value Date/Time   CHOL 198 05/31/2015 0842   TRIG 136 05/31/2015 0842   HDL 38 (L) 05/31/2015 0842   CHOLHDL 5.2 05/31/2015 0842   VLDL 27 05/31/2015 0842   LDLCALC 133 (H) 05/31/2015 0842     Wt Readings from Last 3 Encounters:  06/19/17  185 lb 1.9 oz (84 kg)  05/28/17 178 lb (80.7 kg)  04/30/17 182 lb (82.6 kg)      Other studies Reviewed: Additional studies/ records that were reviewed today include: TTE 2016  Review of the above records today demonstrates:  - Left ventricle: The cavity size was normal. Wall thickness was   increased in a pattern of moderate LVH. There was focal basal   hypertrophy. Systolic function was normal. The estimated ejection   fraction was in the range of 60% to 65%. Wall motion was normal;   there were no regional wall motion abnormalities. - Mitral valve: Valve area by continuity equation (using LVOT   flow): 2.24 cm^2.  Cath 05/2015  Mid RCA to Dist RCA lesion, 20% stenosed. The lesion was previously treated with a stent (unknown type).  Prox RCA lesion, 20% stenosed.  Mid RCA lesion, 20% stenosed.  1st RPLB lesion, 99% stenosed.  Ost RPDA lesion, 60% stenosed.  LM lesion, 40% stenosed.  Ost Cx to Prox Cx lesion, 40% stenosed.  2nd Mrg lesion, 60% stenosed.  Ost LAD to Mid LAD lesion, 30% stenosed.  Mid LAD to Dist LAD  lesion, 60% stenosed.  Ost 2nd Diag to 2nd Diag lesion, 99% stenosed.  Ost 1st Diag to 1st Diag lesion, 99% stenosed.   1. NSTEMI 2. The right coronary artery is diffusely calcified. There is mild disease in the proximal and mid segments. The distal stent is patent. Beyond the stent the posterolateral branches are small in caliber and diffusely diseased. These vessels are too small for PCI.  3. The Circumflex terminates into an obtuse marginal branch. The obtuse marginal branch has 60% serial stenoses followed by diffuse disease in the small caliber distal segment of the obtuse marginal branch.  4. The LAD is severely calcified. The mid and distal vessel has diffuse 60-70% stenosis. Both Diagonal branches are small in caliber and diffusely disease, too small for PCI.  5. No obvious culprit lesions.  6. No focal targets for PCI   ASSESSMENT AND PLAN:  1.  Coronary artery disease: Severe by recent cath.  No current chest pain.  Would continue with current management.  2.  Orthostatic hypotension: Likely due in part to his Parkinson's disease.  Pressure currently well controlled.  3.  Sick sinus syndrome: Status post Medtronic dual-chamber pacemaker implanted 07/10/16.  Atrial lead is malfunctioning.  We have turned his device to VVI set to 50 bpm.  Currently he is at 58 bpm in sinus rhythm.  This Saladin Petrelli hopefully decrease his amount of ventricular pacing.  Susette Seminara get a chest x-ray to further determine the issue with his atrial lead.  Current medicines are reviewed at length with the patient today.   The patient does not have concerns regarding his medicines.  The following changes were made today:  none  Labs/ tests ordered today include:  No orders of the defined types were placed in this encounter.    Disposition:   FU with Morenike Cuff 3 months  Signed, Wynnie Pacetti Meredith Leeds, MD  06/19/2017 2:08 PM     [address] [place]  [phone] (office) [phone] (fax)

## 2017-06-24 ENCOUNTER — Telehealth: Payer: Self-pay | Admitting: Cardiology

## 2017-06-24 ENCOUNTER — Other Ambulatory Visit: Payer: Self-pay

## 2017-06-24 MED ORDER — RANOLAZINE ER 1000 MG PO TB12
1000.0000 mg | ORAL_TABLET | Freq: Two times a day (BID) | ORAL | 11 refills | Status: AC
Start: 2017-06-24 — End: 2018-06-24

## 2017-06-24 NOTE — Telephone Encounter (Signed)
Refill sent.

## 2017-06-24 NOTE — Telephone Encounter (Signed)
Call [place] to [facility]

## 2017-06-25 ENCOUNTER — Encounter: Payer: Self-pay | Admitting: Psychology

## 2017-06-25 ENCOUNTER — Encounter: Payer: Self-pay | Admitting: Neurology

## 2017-06-25 ENCOUNTER — Ambulatory Visit: Payer: Medicare Other | Admitting: Neurology

## 2017-06-25 ENCOUNTER — Ambulatory Visit: Payer: Medicare Other | Admitting: Cardiology

## 2017-06-25 VITALS — BP 124/70 | HR 67 | Ht 71.0 in | Wt 187.0 lb

## 2017-06-25 DIAGNOSIS — G903 Multi-system degeneration of the autonomic nervous system: Secondary | ICD-10-CM

## 2017-06-25 DIAGNOSIS — G20 Parkinson's disease: Secondary | ICD-10-CM

## 2017-06-25 DIAGNOSIS — R441 Visual hallucinations: Secondary | ICD-10-CM | POA: Diagnosis not present

## 2017-06-25 DIAGNOSIS — G20A1 Parkinson's disease without dyskinesia, without mention of fluctuations: Secondary | ICD-10-CM

## 2017-06-25 DIAGNOSIS — F0280 Dementia in other diseases classified elsewhere without behavioral disturbance: Secondary | ICD-10-CM | POA: Diagnosis not present

## 2017-06-25 MED ORDER — QUETIAPINE FUMARATE 25 MG PO TABS: 25.0000 mg | ORAL_TABLET | Freq: Every day | ORAL | 2 refills | Status: DC

## 2017-06-25 NOTE — Progress Notes (Signed)
I met with Kenneth Macias and his daughter Kenneth Macias today when they were in the clinic. We reviewed the [employer] of [place] and the [employer] Scholarship information. I received verbal oermission from eth patient and caregiver to be able to connect them to Malena Peer for the scholarship.   His daughter Kenneth Macias's cell phone number is [phone] and best home number [phone].

## 2017-06-25 NOTE — Progress Notes (Signed)
Cardiology Office Note:    Date:  06/26/2017   ID:  Kenneth Macias, DOB 12/24/1933, MRN [id number]  PCP:  Kenneth Cousin, MD (Inactive)  Cardiologist:  Kenneth More, MD    Referring MD: Kenneth Overly, MD    ASSESSMENT:    1. Neurogenic orthostatic hypotension ([place])   2. Essential hypertension   3. Coronary artery disease of native artery of native heart with stable angina pectoris ([place])   4. Pacemaker lead malfunction, sequela    PLAN:    In order of problems listed above:  1. Improved blood pressures at target continue his current alpha agonist midodrine along with Florinef.  His family tells me he had a recent BMP performed at his PCP office copy requested 2. Stable, I told his family the only blood pressure that we can follow clinically is standing and pleased to see that his neurologist agrees with my approach 3. Stable asymptomatic continue current medical treatment he is a very poor candidate for cardiac interventional procedures 4. He has been seen by EP and plan for pacemaker lead atrial revision.  This should improve symptoms contributed to by isolated RV pacing.   Next appointment: 3 months   Medication Adjustments/Labs and Tests Ordered: Current medicines are reviewed at length with the patient today.  Concerns regarding medicines are outlined above.  No orders of the defined types were placed in this encounter.  No orders of the defined types were placed in this encounter.   Chief Complaint  Patient presents with  . Follow-up    History of Present Illness:    Kenneth Macias is a 83 y.o. male with a hx of symptomatic severe orthostatic hypotension,CAD, atrial flutter previous traumatic ICH,hypertension , Parkinson's and a dual chamber pacemaker for bradycardia and syncope with failure of the atrial lead l last seen 4 weeks ago.  At that time he continued to have symptomatic hypotension at home Florinef was added to his medical regimen he was referred to EP and he is  planned for revision of his atrial lead to try to mitigate the symptoms of isolated RV pacing and on the autonomic failure with Parkinson's.. Compliance with diet, lifestyle and medications: Yes Since the addition of Florinef he has markedly improved he has had no lightheadedness or syncope and sitting and standing blood pressures are greater than 532 systolic.  His symptoms of Parkinson's are mitigated and he will engage in physical therapy.  He has pacemaker atrial lead malposition and will be undergoing revision in the near future.  His family is pleased with his improvement. Past Medical History:  Diagnosis Date  . Adenocarcinoma of colon ([place]) 01/03/2017  . Alzheimer's disease 01/06/2016  . Atrial flutter ([place]) 12/18/2016  . Chest pain 05/29/2015  . Chronic kidney disease 05/31/2015  . Coronary artery disease   . Coronary artery disease involving native coronary artery    Overview:  Overview:  1. PCI and BMS to RCA 2011 2. PCI and DES to RCA 07/30/11  Cardiac cath 05/31/15: Cath 05/31/15 Conclusion  1. Mid RCA to Dist RCA lesion, 20% stenosed. The lesion was previously treated with a stent (unknown type). 2. Prox RCA lesion, 20% stenosed. 3. Mid RCA lesion, 20% stenosed. 4. 1st RPLB lesion, 99% stenosed. 5. Ost RPDA lesion, 60% stenosed. 6. LM lesion, 40% stenosed. 7. Ost Cx to Prox Cx lesion, 40% stenosed. 8. 2nd Mrg lesion, 60% stenosed. 9. Ost LAD to Mid LAD lesion, 30% stenosed. 10. Mid LAD to Dist LAD lesion, 60% stenosed.  11. Ost 2nd Diag to 2nd Diag lesion, 99% stenosed. 12. Ost 1st Diag to 1st Diag lesion, 99% stenosed.  1. Unstable angina 2. The right coronary artery is diffusely calcified. There is mild disease in the proximal and mid segments. The distal stent is patent. Beyond the stent the posterolateral branches are small in caliber and diffusely diseased. These vessels are too small for PCI.  3. The Circumflex terminates into an obtuse margi  . Diabetes mellitus without complication ([place])     . DM (diabetes mellitus) ([place]) 05/31/2015  . Fall 01/06/2016  . HTN (hypertension) 05/31/2015  . Hyperlipidemia 05/31/2015  . Hypertension   . Hypertensive kidney disease with stage 3 chronic kidney disease ([place]) 04/25/2015  . Laceration of scalp 01/06/2016   Overview:  Overview:  Fall  . Leukocytosis 05/11/2016  . Myocardial infarction ([place]) 07/30/2014   Overview:  x2  . Neutrophilic leukocytosis 26/37/8588  . Normal left ventricular systolic function and wall motion 06/25/2016  . NSTEMI (non-ST elevated myocardial infarction) ([place]) 05/31/2015  . Orthostatic hypotension 05/11/2016  . Pacemaker reprogramming/check 07/10/2016  . Parkinson disease ([place]) 05/31/2015  . Paroxysmal atrial flutter ([place]) 06/11/2016  . Subarachnoid hemorrhage ([place]) 01/06/2016  . Syncope 01/09/2016  . Type 2 diabetes mellitus without complication, without long-term current use of insulin ([place]) 06/10/2016  . Unstable angina pectoris ([place]) 05/31/2015    Past Surgical History:  Procedure Laterality Date  . CARDIAC CATHETERIZATION N/A 05/31/2015   Procedure: Left Heart Cath and Coronary Angiography;  Surgeon: Kenneth Blanks, MD;  Location: [facility];  Service: Cardiovascular;  Laterality: N/A;  . COLON RESECTION    . COLONOSCOPY    . CORONARY ANGIOPLASTY WITH STENT PLACEMENT    . HAND SURGERY      Current Medications: Current Meds  Medication Sig  . Carbidopa-Levodopa ER 61.25-245 MG CPCR Take 61.25-245 mg by mouth 3 (three) times daily.  . cholecalciferol (VITAMIN D) 1000 UNITS tablet Take 1,000 Units by mouth daily.  . entacapone (COMTAN) 200 MG tablet Take 200 mg by mouth 3 (three) times daily.   . fludrocortisone (FLORINEF) 0.1 MG tablet Take 1 tablet (0.1 mg total) by mouth daily.  Marland Kitchen FLUoxetine (PROZAC) 10 MG tablet Take 10 mg by mouth daily.  . isosorbide mononitrate (IMDUR) 30 MG 24 hr tablet Take 1 tablet (30 mg total) by mouth daily.  . midodrine (PROAMATINE) 10 MG tablet Take 1 tablet (10 mg  total) by mouth 3 (three) times daily. (Patient taking differently: Take 10 mg by mouth 3 (three) times daily. Take 10 mg am, 5 mg noon, 5 mg evening)  . pantoprazole (PROTONIX) 40 MG tablet Take 40 mg by mouth daily.  . ranolazine (RANEXA) 1000 MG SR tablet Take 1 tablet (1,000 mg total) by mouth 2 (two) times daily.  . saxagliptin HCl (ONGLYZA) 5 MG TABS tablet Take 5 mg by mouth daily.     Allergies:   Patient has no known allergies.   Social History   Socioeconomic History  . Marital status: Widowed    Spouse name: None  . Number of children: 4  . Years of education: 79  . Highest education level: None  Social Needs  . Financial resource strain: None  . Food insecurity - worry: None  . Food insecurity - inability: None  . Transportation needs - medical: None  . Transportation needs - non-medical: None  Occupational History  . Occupation: retired    Fish farm manager: [employer]    Comment: Librarian, academic  Tobacco Use  . Smoking status: Never Smoker  . Smokeless tobacco: Current User    Types: Chew  Substance and Sexual Activity  . Alcohol use: No  . Drug use: No  . Sexual activity: None  Other Topics Concern  . None  Social History Narrative   Patient lives with one of his daughters in a one story home.  Has 4 children.  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  Education: high school.      Family History: The patient's family history includes CAD in his daughter and son; Cancer in his mother; Colon cancer in his mother and son; Diabetes in his father; Heart attack in his father; Heart disease in his daughter and father. ROS:   Please see the history of present illness.    All other systems reviewed and are negative.  EKGs/Labs/Other Studies Reviewed:    The following studies were reviewed today  Recent Labs: No results found for requested labs within last 8760 hours.  Recent Lipid Panel    Component Value Date/Time   CHOL 198 05/31/2015 0842   TRIG 136  05/31/2015 0842   HDL 38 (L) 05/31/2015 0842   CHOLHDL 5.2 05/31/2015 0842   VLDL 27 05/31/2015 0842   LDLCALC 133 (H) 05/31/2015 0842    Physical Exam:    VS:  BP 120/70 (BP Location: Left Arm, Patient Position: Sitting, Cuff Size: Normal)   Pulse 61   Ht 5\' 11"  (1.803 m)   Wt 184 lb (83.5 kg)   SpO2 98%   BMI 25.66 kg/m     Wt Readings from Last 3 Encounters:  06/26/17 184 lb (83.5 kg)  06/25/17 187 lb (84.8 kg)  06/19/17 185 lb 1.9 oz (84 kg)     GEN: He has a resting tremor well nourished, well developed in no acute distress HEENT: Normal NECK: No JVD; No carotid bruits LYMPHATICS: No lymphadenopathy CARDIAC: RRR, no murmurs, rubs, gallops RESPIRATORY:  Clear to auscultation without rales, wheezing or rhonchi  ABDOMEN: Soft, non-tender, non-distended MUSCULOSKELETAL:  No edema; No deformity  SKIN: Warm and dry NEUROLOGIC:  Alert and oriented x 3 PSYCHIATRIC:  Normal affect    Signed, Kenneth More, MD  06/26/2017 12:59 PM    [place]

## 2017-06-25 NOTE — Patient Instructions (Signed)
1. Stop Prozac  2. Change dosage times of Rytary 245 mg to 7 am, 11 am, and 4 pm. Take Comtan at the same times.   3. Start Seroquel 25 mg tablets - one at bedtime. We will call in a few weeks to see how you are doing on the medication.

## 2017-06-26 ENCOUNTER — Ambulatory Visit: Payer: Medicare Other | Admitting: Cardiology

## 2017-06-26 ENCOUNTER — Encounter: Payer: Self-pay | Admitting: Cardiology

## 2017-06-26 VITALS — BP 120/70 | HR 61 | Ht 71.0 in | Wt 184.0 lb

## 2017-06-26 DIAGNOSIS — G903 Multi-system degeneration of the autonomic nervous system: Secondary | ICD-10-CM

## 2017-06-26 DIAGNOSIS — I10 Essential (primary) hypertension: Secondary | ICD-10-CM | POA: Diagnosis not present

## 2017-06-26 DIAGNOSIS — I25118 Atherosclerotic heart disease of native coronary artery with other forms of angina pectoris: Secondary | ICD-10-CM

## 2017-06-26 DIAGNOSIS — T82110S Breakdown (mechanical) of cardiac electrode, sequela: Secondary | ICD-10-CM | POA: Diagnosis not present

## 2017-06-26 NOTE — Telephone Encounter (Signed)
New message ° ° ° ° °Patient returning call. Please call °

## 2017-06-26 NOTE — Patient Instructions (Signed)

## 2017-06-27 NOTE — Telephone Encounter (Signed)
This encounter was created in error - please disregard.

## 2017-06-28 ENCOUNTER — Telehealth: Payer: Self-pay | Admitting: Cardiology

## 2017-06-28 NOTE — Telephone Encounter (Signed)
Informed patient's DPR of results and verbal understanding expressed.   She understands Dr. Macky Lower nurse will call next week to arrange lead revision. She was grateful for assistance and awaits call next week.

## 2017-06-28 NOTE — Telephone Encounter (Signed)
F/u Message  Pt wife stating she was returning RN call. Please call back to discuss

## 2017-06-28 NOTE — Telephone Encounter (Signed)
-----   Message from Will Meredith Leeds, MD sent at 06/24/2017 11:20 AM EST ----- RA lead in RV. Needs lead revision.

## 2017-07-02 NOTE — Telephone Encounter (Signed)
Follow up    Patient wife is calling to check to see if you have surgery schedule yet?

## 2017-07-02 NOTE — Telephone Encounter (Signed)
Spoke to wife.  Pt agreeable to 12/13 for procedure. She is aware I will make arrangements and call her by the end of the week to review instructions.

## 2017-07-04 NOTE — Telephone Encounter (Signed)
Called and spoke with dtr to review procedure instructions. PPM lead revision scheduled for 12/13 Pre procedure lab will be performed the morning of the procedure.  Arrive at [facility] at 7:30 a.m. NPO after MN the night before his procedure. No diabetic medication the morning of his procedure and may take morning medications with sip of water. Advised about washing chest/neck w/ antibacterial soap. Wound check schedule for 12/26 Patient's dtr verbalized understanding and agreeable to plan.

## 2017-07-05 ENCOUNTER — Other Ambulatory Visit: Payer: Self-pay | Admitting: Cardiology

## 2017-07-05 DIAGNOSIS — T82110D Breakdown (mechanical) of cardiac electrode, subsequent encounter: Secondary | ICD-10-CM

## 2017-07-11 ENCOUNTER — Ambulatory Visit (HOSPITAL_COMMUNITY)
Admission: RE | Admit: 2017-07-11 | Discharge: 2017-07-12 | Disposition: A | Discharge status: Home / Self Care | Payer: Medicare Other | Source: Ambulatory Visit | Attending: Cardiology | Admitting: Cardiology

## 2017-07-11 ENCOUNTER — Encounter (HOSPITAL_COMMUNITY)
Admission: RE | Disposition: A | Discharge status: Home / Self Care | Payer: Self-pay | Source: Ambulatory Visit | Attending: Cardiology

## 2017-07-11 DIAGNOSIS — T82110D Breakdown (mechanical) of cardiac electrode, subsequent encounter: Secondary | ICD-10-CM

## 2017-07-11 DIAGNOSIS — Z79899 Other long term (current) drug therapy: Secondary | ICD-10-CM | POA: Diagnosis not present

## 2017-07-11 DIAGNOSIS — E119 Type 2 diabetes mellitus without complications: Secondary | ICD-10-CM | POA: Insufficient documentation

## 2017-07-11 DIAGNOSIS — I495 Sick sinus syndrome: Secondary | ICD-10-CM | POA: Diagnosis present

## 2017-07-11 DIAGNOSIS — Z95818 Presence of other cardiac implants and grafts: Secondary | ICD-10-CM

## 2017-07-11 DIAGNOSIS — T82120A Displacement of cardiac electrode, initial encounter: Secondary | ICD-10-CM | POA: Diagnosis present

## 2017-07-11 DIAGNOSIS — Y713 Surgical instruments, materials and cardiovascular devices (including sutures) associated with adverse incidents: Secondary | ICD-10-CM | POA: Insufficient documentation

## 2017-07-11 DIAGNOSIS — I2510 Atherosclerotic heart disease of native coronary artery without angina pectoris: Secondary | ICD-10-CM | POA: Diagnosis not present

## 2017-07-11 DIAGNOSIS — Z950 Presence of cardiac pacemaker: Secondary | ICD-10-CM | POA: Diagnosis not present

## 2017-07-11 DIAGNOSIS — I10 Essential (primary) hypertension: Secondary | ICD-10-CM | POA: Insufficient documentation

## 2017-07-11 DIAGNOSIS — I4892 Unspecified atrial flutter: Secondary | ICD-10-CM | POA: Insufficient documentation

## 2017-07-11 DIAGNOSIS — G20 Parkinson's disease: Secondary | ICD-10-CM | POA: Insufficient documentation

## 2017-07-11 HISTORY — PX: PACEMAKER REVISION: EP1221

## 2017-07-11 LAB — CBC
HEMATOCRIT: 35.9 % — AB (ref 39.0–52.0)
Hemoglobin: 11.6 g/dL — ABNORMAL LOW (ref 13.0–17.0)
MCH: 29.1 pg (ref 26.0–34.0)
MCHC: 32.3 g/dL (ref 30.0–36.0)
MCV: 90.2 fL (ref 78.0–100.0)
Platelets: 179 10*3/uL (ref 150–400)
RBC: 3.98 MIL/uL — ABNORMAL LOW (ref 4.22–5.81)
RDW: 14 % (ref 11.5–15.5)
WBC: 8.9 10*3/uL (ref 4.0–10.5)

## 2017-07-11 LAB — BASIC METABOLIC PANEL
Anion gap: 8 (ref 5–15)
BUN: 14 mg/dL (ref 6–20)
CHLORIDE: 102 mmol/L (ref 101–111)
CO2: 27 mmol/L (ref 22–32)
CREATININE: 1.26 mg/dL — AB (ref 0.61–1.24)
Calcium: 9 mg/dL (ref 8.9–10.3)
GFR, EST AFRICAN AMERICAN: 60 mL/min — AB (ref >60)
GFR, EST NON AFRICAN AMERICAN: 52 mL/min — AB (ref >60)
Glucose, Bld: 118 mg/dL — ABNORMAL HIGH (ref 65–99)
POTASSIUM: 3.8 mmol/L (ref 3.5–5.1)
Sodium: 137 mmol/L (ref 135–145)

## 2017-07-11 LAB — GLUCOSE, CAPILLARY
GLUCOSE-CAPILLARY: 106 mg/dL — AB (ref 65–99)
Glucose-Capillary: 87 mg/dL (ref 65–99)

## 2017-07-11 LAB — SURGICAL PCR SCREEN
MRSA, PCR: NEGATIVE
Staphylococcus aureus: NEGATIVE

## 2017-07-11 SURGERY — PACEMAKER REVISION

## 2017-07-11 MED ORDER — DIPHENHYDRAMINE-APAP (SLEEP) 25-500 MG PO TABS: 1.0000 | ORAL_TABLET | Freq: Every evening | ORAL | Status: DC | PRN

## 2017-07-11 MED ORDER — CEFAZOLIN SODIUM-DEXTROSE 1-4 GM/50ML-% IV SOLN
1.0000 g | Freq: Four times a day (QID) | INTRAVENOUS | Status: AC
  Administered 2017-07-11 – 2017-07-12 (×3): 1 g via INTRAVENOUS
  Filled 2017-07-11 (×3): qty 50

## 2017-07-11 MED ORDER — DIPHENHYDRAMINE HCL 25 MG PO CAPS
25.0000 mg | ORAL_CAPSULE | Freq: Every evening | ORAL | Status: DC | PRN
  Filled 2017-07-11: qty 1

## 2017-07-11 MED ORDER — PANTOPRAZOLE SODIUM 40 MG PO TBEC
40.0000 mg | DELAYED_RELEASE_TABLET | Freq: Every day | ORAL | Status: DC
Start: 2017-07-12 — End: 2017-07-12
  Administered 2017-07-12: 40 mg via ORAL
  Filled 2017-07-11: qty 1

## 2017-07-11 MED ORDER — LIDOCAINE HCL (PF) 1 % IJ SOLN
INTRAMUSCULAR | Status: DC | PRN
  Administered 2017-07-11: 45 mL

## 2017-07-11 MED ORDER — HEPARIN (PORCINE) IN NACL 2-0.9 UNIT/ML-% IJ SOLN
INTRAMUSCULAR | Status: AC
  Filled 2017-07-11: qty 500

## 2017-07-11 MED ORDER — RANOLAZINE ER 500 MG PO TB12
1000.0000 mg | ORAL_TABLET | Freq: Two times a day (BID) | ORAL | Status: DC
  Administered 2017-07-12: 1000 mg via ORAL
  Filled 2017-07-11 (×2): qty 2

## 2017-07-11 MED ORDER — LIDOCAINE HCL (PF) 1 % IJ SOLN
INTRAMUSCULAR | Status: AC
  Filled 2017-07-11: qty 30

## 2017-07-11 MED ORDER — SODIUM CHLORIDE 0.9 % IR SOLN
Status: AC
  Filled 2017-07-11: qty 2

## 2017-07-11 MED ORDER — LORAZEPAM 2 MG/ML IJ SOLN
1.0000 mg | Freq: Once | INTRAMUSCULAR | Status: AC
  Administered 2017-07-11: 1 mg via INTRAVENOUS
  Filled 2017-07-11: qty 1

## 2017-07-11 MED ORDER — ENTACAPONE 200 MG PO TABS
200.0000 mg | ORAL_TABLET | Freq: Three times a day (TID) | ORAL | Status: DC
  Administered 2017-07-11 – 2017-07-12 (×2): 200 mg via ORAL
  Filled 2017-07-11 (×3): qty 1

## 2017-07-11 MED ORDER — ONDANSETRON HCL 4 MG/2ML IJ SOLN
4.0000 mg | Freq: Four times a day (QID) | INTRAMUSCULAR | Status: DC | PRN
Start: 2017-07-11 — End: 2017-07-12

## 2017-07-11 MED ORDER — FLUOXETINE HCL 10 MG PO CAPS
10.0000 mg | ORAL_CAPSULE | Freq: Every day | ORAL | Status: DC
  Filled 2017-07-11: qty 1

## 2017-07-11 MED ORDER — SODIUM CHLORIDE 0.9 % IR SOLN
80.0000 mg | Status: AC
  Administered 2017-07-11: 80 mg

## 2017-07-11 MED ORDER — SODIUM CHLORIDE 0.9 % IV SOLN
INTRAVENOUS | Status: DC
  Administered 2017-07-11: 09:00:00 via INTRAVENOUS

## 2017-07-11 MED ORDER — MUPIROCIN 2 % EX OINT
1.0000 "application " | TOPICAL_OINTMENT | Freq: Once | CUTANEOUS | Status: AC
  Administered 2017-07-11: 1 via TOPICAL

## 2017-07-11 MED ORDER — CEFAZOLIN SODIUM-DEXTROSE 2-4 GM/100ML-% IV SOLN
2.0000 g | INTRAVENOUS | Status: AC
  Administered 2017-07-11: 2 g via INTRAVENOUS

## 2017-07-11 MED ORDER — ACETAMINOPHEN 500 MG PO TABS: 500.0000 mg | ORAL_TABLET | Freq: Every evening | ORAL | Status: DC | PRN

## 2017-07-11 MED ORDER — LIDOCAINE HCL (PF) 1 % IJ SOLN
INTRAMUSCULAR | Status: AC
Start: 2017-07-11 — End: ?
  Filled 2017-07-11: qty 30

## 2017-07-11 MED ORDER — LIDOCAINE HCL (PF) 1 % IJ SOLN
INTRAMUSCULAR | Status: AC
  Filled 2017-07-11: qty 60

## 2017-07-11 MED ORDER — FLUOXETINE HCL 20 MG PO TABS
10.0000 mg | ORAL_TABLET | Freq: Every day | ORAL | Status: DC
  Filled 2017-07-11: qty 1

## 2017-07-11 MED ORDER — MUPIROCIN 2 % EX OINT
TOPICAL_OINTMENT | CUTANEOUS | Status: AC
  Administered 2017-07-11: 1 via TOPICAL
  Filled 2017-07-11: qty 22

## 2017-07-11 MED ORDER — LINAGLIPTIN 5 MG PO TABS
5.0000 mg | ORAL_TABLET | Freq: Every day | ORAL | Status: DC
  Administered 2017-07-11 – 2017-07-12 (×2): 5 mg via ORAL
  Filled 2017-07-11 (×2): qty 1

## 2017-07-11 MED ORDER — CARBIDOPA-LEVODOPA ER 61.25-245 MG PO CPCR
1.0000 | ORAL_CAPSULE | Freq: Three times a day (TID) | ORAL | Status: DC
  Administered 2017-07-12: 1 via ORAL
  Filled 2017-07-11 (×3): qty 1

## 2017-07-11 MED ORDER — VITAMIN D 1000 UNITS PO TABS
1000.0000 [IU] | ORAL_TABLET | Freq: Every day | ORAL | Status: DC
  Administered 2017-07-12: 1000 [IU] via ORAL
  Filled 2017-07-11: qty 1

## 2017-07-11 MED ORDER — CEFAZOLIN SODIUM-DEXTROSE 2-4 GM/100ML-% IV SOLN
INTRAVENOUS | Status: AC
Start: 2017-07-11 — End: ?
  Filled 2017-07-11: qty 100

## 2017-07-11 MED ORDER — ACETAMINOPHEN 325 MG PO TABS: 325.0000 mg | ORAL_TABLET | ORAL | Status: DC | PRN

## 2017-07-11 MED ORDER — FLUDROCORTISONE ACETATE 0.1 MG PO TABS
0.1000 mg | ORAL_TABLET | Freq: Every day | ORAL | Status: DC
Start: 2017-07-12 — End: 2017-07-12
  Administered 2017-07-12: 0.1 mg via ORAL
  Filled 2017-07-11: qty 1

## 2017-07-11 MED ORDER — HYDROCORTISONE 1 % EX CREA
1.0000 "application " | TOPICAL_CREAM | Freq: Three times a day (TID) | CUTANEOUS | Status: DC | PRN
  Filled 2017-07-11: qty 28

## 2017-07-11 MED ORDER — ISOSORBIDE MONONITRATE ER 30 MG PO TB24
30.0000 mg | ORAL_TABLET | Freq: Every day | ORAL | Status: DC
Start: 2017-07-12 — End: 2017-07-12
  Administered 2017-07-12: 30 mg via ORAL
  Filled 2017-07-11: qty 1

## 2017-07-11 SURGICAL SUPPLY — 4 items
CABLE SURGICAL S-101-97-12 (CABLE) ×2 IMPLANT
KIT LEAD ACCESSORY 6056M PINCH (KITS) ×2 IMPLANT
PAD DEFIB LIFELINK (PAD) ×2 IMPLANT
TRAY PACEMAKER INSERTION (PACKS) ×2 IMPLANT

## 2017-07-11 NOTE — Progress Notes (Signed)
Patient agitated attempting to hit staff. Staff attempted to calm patient down. RN paged Cards NP for new orders. New orders received, 1 mg of ativan given IV. Pt son, Ruth called. Son states he is coming to visit. Pt back in bed after ativan. Bed alarm on.

## 2017-07-11 NOTE — H&P (Signed)
Kenneth Macias has presented today for surgery, with the diagnosis of sick sinus sybdrome.  He has a Medtronic dual chamber pacemaker in place with an atrial lead dislodgement. The various methods of treatment have been discussed with the patient and family. After consideration of risks, benefits and other options for treatment, the patient has consented to  Procedure(s): Atrial lead revision as a surgical intervention .  Risks include but not limited to bleeding, tamponade, infection, pneumothorax, among others. The patient's history has been reviewed, patient examined, no change in status, stable for surgery.  I have reviewed the patient's chart and labs.  Questions were answered to the patient's satisfaction.    Opal Mckellips Curt Bears, MD 07/11/2017 8:07 AM

## 2017-07-11 NOTE — Progress Notes (Signed)
This Probation officer contacted cardiology on-call made aware that pt continues to move left arm and try to get out of bed (sundowning).  No new orders

## 2017-07-11 NOTE — Progress Notes (Signed)
Orthopedic Tech Progress Note Patient Details:  Kenneth Macias 02-15-1934 [id number] Patient has arm sling. Patient ID: [name], male   DOB: 04/15/1934, 83 y.o.   MRN: [id number]   [name] 07/11/2017, 6:11 PM

## 2017-07-11 NOTE — Progress Notes (Signed)
Pt family at bedside to get pt to take medication.  Pt continues to spit out medication.  Family stated pt goes to sleep on regular basis without taking medication not worried about him not taking.  Notified on-call pt spitting out medication

## 2017-07-12 ENCOUNTER — Encounter (HOSPITAL_COMMUNITY): Payer: Self-pay | Admitting: Cardiology

## 2017-07-12 ENCOUNTER — Ambulatory Visit (HOSPITAL_COMMUNITY): Payer: Medicare Other

## 2017-07-12 ENCOUNTER — Telehealth: Payer: Self-pay | Admitting: Neurology

## 2017-07-12 DIAGNOSIS — T82120A Displacement of cardiac electrode, initial encounter: Secondary | ICD-10-CM | POA: Diagnosis not present

## 2017-07-12 DIAGNOSIS — I495 Sick sinus syndrome: Secondary | ICD-10-CM

## 2017-07-12 DIAGNOSIS — I4892 Unspecified atrial flutter: Secondary | ICD-10-CM | POA: Diagnosis not present

## 2017-07-12 DIAGNOSIS — G20 Parkinson's disease: Secondary | ICD-10-CM | POA: Diagnosis not present

## 2017-07-12 MED ORDER — RIVAROXABAN 20 MG PO TABS: 20.0000 mg | ORAL_TABLET | Freq: Every day | ORAL | 1 refills | Status: DC

## 2017-07-12 MED ORDER — HYDRALAZINE HCL 20 MG/ML IJ SOLN
10.0000 mg | Freq: Once | INTRAMUSCULAR | Status: AC
  Administered 2017-07-12: 10 mg via INTRAVENOUS
  Filled 2017-07-12: qty 1

## 2017-07-12 NOTE — Progress Notes (Signed)
Pt transported to xray 

## 2017-07-12 NOTE — Discharge Summary (Signed)
ELECTROPHYSIOLOGY PROCEDURE DISCHARGE SUMMARY    Patient ID: Kenneth Macias,  MRN: [id number], DOB/AGE: 81-Oct-1935 81 y.o.  Admit date: 07/11/2017 Discharge date: 07/12/2017  Primary Care Physician: Kenneth Cousin, MD (Inactive) Primary Cardiologist: [facility] Electrophysiologist: [facility]  Primary Discharge Diagnosis:  Right atrial lead dislodgement status post pacemaker implantation this admission  Secondary Discharge Diagnosis:  1.  Atrial flutter  2.  Parkinsons 3.  CAD 4.  HTN 5.  Diabetes  No Known Allergies   Procedures This Admission:  1.  Right atrial lead revision on  07/11/17 by Dr Kenneth Macias.  See op note for full details. There were no immediate post procedure complications. 2.  CXR on 07/12/17 demonstrated no pneumothorax status post device implantation.   Brief HPI: Kenneth Macias is a 83 y.o. male with a past medical history of SSS. He underwent PPM implantation at an outside hospital and was found to have RA led dislodgement.  He was symptomatic with hypotension and was referred for evaluation of lead revision.   Risks, benefits, and alternatives to lead revision were reviewed with the patient who wished to proceed.   Hospital Course:  The patient was admitted and underwent RA lead revision with details as outlined above.  He  was monitored on telemetry overnight which demonstrated atrial flutter with V pacing.  Left chest was without hematoma or ecchymosis.  The device was interrogated and found to be functioning normally.  CXR was obtained and demonstrated no pneumothorax status post device implantation.  Wound care, arm mobility, and restrictions were reviewed with the patient.  The patient was examined and considered stable for discharge to home.   Because of recurrent atrial flutter and CHADS2VASC of 5, he Kenneth Macias need further discussions as an outpatient by primary cardiologist regarding Kenneth Macias.    Physical Exam: Vitals:   07/11/17 2245 07/12/17 0219 07/12/17 0339  07/12/17 0550  BP:  (!) 175/80 (!) 128/52 (!) 157/63  Pulse: 66 63 68 61  Resp:  20  20  Temp:  98.1 F (36.7 C)  98.3 F (36.8 C)  TempSrc:  Axillary  Oral  SpO2: 94% 94%  98%  Weight:      Height:        GEN- The patient is well appearing, alert and oriented x 3 today.   HEENT: normocephalic, atraumatic; sclera clear, conjunctiva pink; hearing intact; oropharynx clear; neck supple  Lungs- Clear to ausculation bilaterally, normal work of breathing.  No wheezes, rales, rhonchi Heart- Regular rate and rhythm (paced) GI- soft, non-tender, non-distended, bowel sounds present  Extremities- no clubbing, cyanosis, or edema; DP/PT/radial pulses 2+ bilaterally MS- no significant deformity or atrophy Skin- warm and dry, no rash or lesion, left chest without hematoma/ecchymosis Psych- euthymic mood, full affect Neuro- strength and sensation are intact   Labs:   Lab Results  Component Value Date   WBC 8.9 07/11/2017   HGB 11.6 (L) 07/11/2017   HCT 35.9 (L) 07/11/2017   MCV 90.2 07/11/2017   PLT 179 07/11/2017    Recent Labs  Lab 07/11/17 0742  NA 137  K 3.8  CL 102  CO2 27  BUN 14  CREATININE 1.26*  CALCIUM 9.0  GLUCOSE 118*    Discharge Medications:  Allergies as of 07/12/2017   No Known Allergies     Medication List    STOP taking these medications   midodrine 10 MG tablet Commonly known as:  PROAMATINE   QUEtiapine 25 MG tablet Commonly known as:  SEROQUEL  TAKE these medications   Carbidopa-Levodopa ER 61.25-245 MG Cpcr Take 1 tablet by mouth 3 (three) times daily.   cholecalciferol 1000 units tablet Commonly known as:  VITAMIN D Take 1,000 Units by mouth daily.   diphenhydramine-acetaminophen 25-500 MG Tabs tablet Commonly known as:  TYLENOL PM Take 1 tablet by mouth at bedtime as needed (for sleep).   entacapone 200 MG tablet Commonly known as:  COMTAN Take 200 mg by mouth 3 (three) times daily.   fludrocortisone 0.1 MG tablet Commonly  known as:  FLORINEF Take 1 tablet (0.1 mg total) by mouth daily.   FLUoxetine 10 MG tablet Commonly known as:  PROZAC Take 10 mg by mouth at bedtime.   hydrocortisone cream 1 % Apply 1 application topically as needed for itching.   isosorbide mononitrate 30 MG 24 hr tablet Commonly known as:  IMDUR Take 1 tablet (30 mg total) by mouth daily.   ONGLYZA 5 MG Tabs tablet Generic drug:  saxagliptin HCl Take 5 mg by mouth daily.   pantoprazole 40 MG tablet Commonly known as:  PROTONIX Take 40 mg by mouth daily.   ranolazine 1000 MG SR tablet Commonly known as:  RANEXA Take 1 tablet (1,000 mg total) by mouth 2 (two) times daily.       Disposition:  Discharge Instructions    Diet - low sodium heart healthy   Complete by:  As directed    Increase activity slowly   Complete by:  As directed      Follow-up Information    [place] [facility] Follow up on 07/24/2017.   Specialty:  Cardiology Why:  at [facility] information: [address], [place]       Kenneth Haw, MD Follow up on 10/16/2017.   Specialty:  Cardiology Why:  at [facility] information: [place] [address] [phone]           Duration of Discharge Encounter: Greater than 30 minutes including physician time.  Signed, Kenneth Marshall, NP 07/12/2017 8:05 AM  I have seen and examined this patient with Kenneth Macias.  Agree with above, note added to reflect my findings.  On exam, RRR, no murmurs, lungs clear.  Patient has a history of sick sinus syndrome, presented for atrial lead revision due to lead dislodgment.  Postop check and x-ray show atrial lead remains in the right atrium without issue.  Plan for discharge today with follow-up in device clinic.  Kenneth Macias M. Kenneth Amspacher MD 07/12/2017 8:45 AM

## 2017-07-12 NOTE — Progress Notes (Signed)
Pt discharge education provided at bedside with pt and pt family

## 2017-07-12 NOTE — Progress Notes (Signed)
NT removed IVs, catheter intact and telemetry removed. Pt home prescriptions were returned to pt daughter at bedside.

## 2017-07-12 NOTE — Care Management Note (Signed)
Case Management Note  Patient Details  Name: Kenneth Macias MRN: [id number] Date of Birth: 1933-12-18  Subjective/Objective:                 Patient with order to DC to home today. Chart reviewed. No Home Health or Equipment needs, no unacknowledged Case Management consults or medication needs identified at the time of this note. Plan for DC to home.  CM signing off. If new needs arise today prior to discharge, please call Carles Collet RN CM at [phone].   Action/Plan:   Expected Discharge Date:  07/12/17               Expected Discharge Plan:  Home/Self Care  In-House Referral:     Discharge planning Services  CM Consult  Post Acute Care Choice:    Choice offered to:     DME Arranged:    DME Agency:     HH Arranged:    HH Agency:     Status of Service:  Completed, signed off  If discussed at [employer] of Stay Meetings, dates discussed:    Additional Comments:  Carles Collet, RN 07/12/2017, 9:50 AM

## 2017-07-12 NOTE — Telephone Encounter (Signed)
Called patient's daughter to see how he was doing on Seroquel. She states he never started medication. They read side effects and discussed with heart doctor and decided they didn't want to take medication. They were not interested in an alternative. They will keep appt in March.

## 2017-07-12 NOTE — Progress Notes (Signed)
Patient blood pressure 175/80, pulse 63, verbal order given for one X dose hydralazine med admninistered

## 2017-07-12 NOTE — Plan of Care (Signed)
  Progressing Safety: Ability to remain free from injury will improve 07/12/2017 0603 - Progressing by Ardine Eng, RN

## 2017-07-12 NOTE — Discharge Instructions (Signed)
° ° °  Supplemental Discharge Instructions for  Pacemaker/Defibrillator Patients  Activity No heavy lifting or vigorous activity with your left/right arm for 6 to 8 weeks.  Do not raise your left/right arm above your head for one week.  Gradually raise your affected arm as drawn below.           __          07/16/17                  07/17/17             07/18/17                   07/19/17   WOUND CARE - Keep the wound area clean and dry.  Do not get this area wet for one week. No showers for one week; you may shower on   07/19/17  . - The tape/steri-strips on your wound will fall off; do not pull them off.  No bandage is needed on the site.  DO  NOT apply any creams, oils, or ointments to the wound area. - If you notice any drainage or discharge from the wound, any swelling or bruising at the site, or you develop a fever > 101? F after you are discharged home, call the office at once.  Special Instructions - You are still able to use cellular telephones; use the ear opposite the side where you have your pacemaker/defibrillator.  Avoid carrying your cellular phone near your device. - When traveling through airports, show security personnel your identification card to avoid being screened in the metal detectors.  Ask the security personnel to use the hand wand. - Avoid arc welding equipment, MRI testing (magnetic resonance imaging), TENS units (transcutaneous nerve stimulators).  Call the office for questions about other devices. - Avoid electrical appliances that are in poor condition or are not properly grounded. - Microwave ovens are safe to be near or to operate.

## 2017-07-15 ENCOUNTER — Telehealth: Payer: Self-pay | Admitting: Neurology

## 2017-07-15 MED ORDER — CARBIDOPA-LEVODOPA ER 61.25-245 MG PO CPCR: 1.0000 | ORAL_CAPSULE | Freq: Three times a day (TID) | ORAL | 5 refills | Status: DC

## 2017-07-15 MED ORDER — ENTACAPONE 200 MG PO TABS: 200.0000 mg | ORAL_TABLET | Freq: Three times a day (TID) | ORAL | 5 refills | Status: DC

## 2017-07-15 NOTE — Telephone Encounter (Signed)
RX sent to pharmacy  

## 2017-07-15 NOTE — Telephone Encounter (Signed)
Patient needs a RX called into the [facility] in [place]  He will need the Carbidopa levodopa and the entacapone

## 2017-07-18 ENCOUNTER — Encounter: Payer: Self-pay | Admitting: Cardiology

## 2017-07-18 ENCOUNTER — Ambulatory Visit (INDEPENDENT_AMBULATORY_CARE_PROVIDER_SITE_OTHER): Payer: Medicare Other | Admitting: Cardiology

## 2017-07-18 VITALS — BP 148/80 | HR 74 | Ht 72.0 in | Wt 184.0 lb

## 2017-07-18 DIAGNOSIS — Z950 Presence of cardiac pacemaker: Secondary | ICD-10-CM

## 2017-07-18 DIAGNOSIS — G903 Multi-system degeneration of the autonomic nervous system: Secondary | ICD-10-CM | POA: Diagnosis not present

## 2017-07-18 DIAGNOSIS — I4892 Unspecified atrial flutter: Secondary | ICD-10-CM | POA: Diagnosis not present

## 2017-07-18 NOTE — Patient Instructions (Addendum)
Medication Instructions:  Your physician recommends that you continue on your current medications as directed. Please refer to the Current Medication list given to you today.  Labwork: None  Testing/Procedures: None  Follow-Up: Your physician recommends that you schedule a follow-up appointment in: 3 months.  Any Other Special Instructions Will Be Listed Below (If Applicable).     If you need a refill on your cardiac medications before your next appointment, please call your pharmacy.    KardiaMobile [email or web address]        FDA-cleared, clinical grade mobile EKG monitor: Jodelle Red is the most clinically-validated mobile EKG used by the world's leading cardiac care medical professionals With Basic service, know instantly if your heart rhythm is normal or if atrial fibrillation is detected, and email the last single EKG recording to yourself or your doctor Premium service, available for purchase through the Kardia app for $9.99 per month or $99 per year, includes unlimited history and storage of your EKG recordings, a monthly EKG summary report to share with your doctor, along with the ability to track your blood pressure, activity and weight Includes one KardiaMobile phone clip FREE SHIPPING: Standard delivery 1-3 business days. Orders placed by 11:00am PST will ship that afternoon. Otherwise, will ship next business day. All orders ship via [employer] from [place], [place]

## 2017-07-18 NOTE — Progress Notes (Signed)
Cardiology Office Note:    Date:  07/18/2017   ID:  [name], DOB 28-Jul-1934, MRN [id number]  PCP:  Charletta Cousin, MD (Inactive)  Cardiologist:  [name], MD    Referring MD: [name] found    ASSESSMENT:    1. Neurogenic orthostatic hypotension ([place])   2. Pacemaker   3. Paroxysmal atrial flutter ([facility])    PLAN:    In order of problems listed above:  1. He is markedly improved with a combination of Florinef and Midrin and will continue his current medications.  He has never started support hose or abdominal binder.  He has hypertension supine and the only blood pressure that we will follow him clinically as his standing is very closely supervised at home and has had no recurrent falls. 2. Stable improved after recent lead revision 3. Head atrial flutter in the hospital he has had in the past if it was frequent and symptomatic we could suppress him with an antiarrhythmic drug with his coronary artery disease I think her choices would be limited and likely would produce toxicity or worsening in his orthostatic hypotension as a consequence.  If he does have frequent episodes of atrial arrhythmia we can place him on reduced dose anticoagulant Eliquis which I think would be safe as he is intracranial hemorrhage is old and he has not fallen.  At this time will stand back and await results of his pacemaker download. He has my opinion about cataract surgery I have no hesitancy.   Next appointment: 3 months   Medication Adjustments/Labs and Tests Ordered: Current medicines are reviewed at length with the patient today.  Concerns regarding medicines are outlined above.  No orders of the defined types were placed in this encounter.  No orders of the defined types were placed in this encounter.   Chief Complaint  Patient presents with  . Follow-up    Pacemaker check/ repositioned wires in it.    History of Present Illness:    Kenneth Macias is a 83 y.o. male with a hx of  symptomatic severe orthostatic hypotension,CAD, atrial flutter previous traumatic ICH,hypertension , Parkinson's and a dual chamber pacemaker for bradycardia and syncopewith failure of the atrial leadand recent revision by dr Curt Bears last seen one month ago.Because of recurrent atrial flutter and CHADS2VASC of 5, he will need further discussions as an outpatient by primary cardiologist regarding [place]. Compliance with diet, lifestyle and medications: Yes Past Medical History:  Diagnosis Date  . Adenocarcinoma of colon ([place]) 01/03/2017  . Alzheimer's disease 01/06/2016  . Atrial flutter ([place]) 12/18/2016  . Chest pain 05/29/2015  . Chronic kidney disease 05/31/2015  . Coronary artery disease involving native coronary artery    Overview:  Overview:  1. PCI and BMS to RCA 2011 2. PCI and DES to RCA 07/30/11  Cardiac cath 05/31/15: Cath 05/31/15 Conclusion  1. Mid RCA to Dist RCA lesion, 20% stenosed. The lesion was previously treated with a stent (unknown type). 2. Prox RCA lesion, 20% stenosed. 3. Mid RCA lesion, 20% stenosed. 4. 1st RPLB lesion, 99% stenosed. 5. Ost RPDA lesion, 60% stenosed. 6. LM lesion, 40% stenosed. 7. Ost Cx to Prox Cx lesion, 40% stenosed. 8. 2nd Mrg lesion, 60% stenosed. 9. Ost LAD to Mid LAD lesion, 30% stenosed. 10. Mid LAD to Dist LAD lesion, 60% stenosed. 11. Ost 2nd Diag to 2nd Diag lesion, 99% stenosed. 12. Ost 1st Diag to 1st Diag lesion, 99% stenosed.  1. Unstable angina 2. The right coronary  artery is diffusely calcified. There is mild disease in the proximal and mid segments. The distal stent is patent. Beyond the stent the posterolateral branches are small in caliber and diffusely diseased. These vessels are too small for PCI.  3. The Circumflex terminates into an obtuse margi  . DM (diabetes mellitus) ([place]) 05/31/2015  . Fall 01/06/2016  . Hyperlipidemia 05/31/2015  . Hypertension   . Hypertensive kidney disease with stage 3 chronic kidney disease ([place]) 04/25/2015  . Leukocytosis  05/11/2016  . Myocardial infarction ([place]) 07/30/2014   Overview:  x2  . NSTEMI (non-ST elevated myocardial infarction) ([place]) 05/31/2015  . Orthostatic hypotension 05/11/2016  . Parkinson disease ([place]) 05/31/2015  . Paroxysmal atrial flutter ([place]) 06/11/2016  . Subarachnoid hemorrhage ([place]) 01/06/2016  . Syncope 01/09/2016  . Type 2 diabetes mellitus without complication, without long-term current use of insulin ([place]) 06/10/2016  . Unstable angina pectoris ([place]) 05/31/2015    Past Surgical History:  Procedure Laterality Date  . CARDIAC CATHETERIZATION N/A 05/31/2015   Procedure: Left Heart Cath and Coronary Angiography;  Surgeon: Burnell Blanks, MD;  Location: [facility];  Service: Cardiovascular;  Laterality: N/A;  . COLON RESECTION    . COLONOSCOPY    . CORONARY ANGIOPLASTY WITH STENT PLACEMENT    . HAND SURGERY    . PACEMAKER REVISION N/A 07/11/2017   Procedure: PACEMAKER REVISION;  Surgeon: Constance Haw, MD;  Location: [facility];  Service: Cardiovascular;  Laterality: N/A;    Current Medications: Current Meds  Medication Sig  . Carbidopa-Levodopa ER 61.25-245 MG CPCR Take 1 tablet by mouth 3 (three) times daily.  . cholecalciferol (VITAMIN D) 1000 UNITS tablet Take 1,000 Units by mouth daily.  . diphenhydramine-acetaminophen (TYLENOL PM) 25-500 MG TABS tablet Take 1 tablet by mouth at bedtime as needed (for sleep).  . entacapone (COMTAN) 200 MG tablet Take 1 tablet (200 mg total) by mouth 3 (three) times daily.  . fludrocortisone (FLORINEF) 0.1 MG tablet Take 1 tablet (0.1 mg total) by mouth daily.  Marland Kitchen FLUoxetine (PROZAC) 10 MG tablet Take 10 mg by mouth at bedtime.   . hydrocortisone cream 1 % Apply 1 application topically as needed for itching.  . isosorbide mononitrate (IMDUR) 30 MG 24 hr tablet Take 1 tablet (30 mg total) by mouth daily.  . midodrine (PROAMATINE) 10 MG tablet Take 10 mg by mouth 3 (three) times daily.  . pantoprazole (PROTONIX) 40 MG  tablet Take 40 mg by mouth daily.  . ranolazine (RANEXA) 1000 MG SR tablet Take 1 tablet (1,000 mg total) by mouth 2 (two) times daily.  . saxagliptin HCl (ONGLYZA) 5 MG TABS tablet Take 5 mg by mouth daily.     Allergies:   Patient has no known allergies.   Social History   Socioeconomic History  . Marital status: Widowed    Spouse name: None  . Number of children: 4  . Years of education: 75  . Highest education level: None  Social Needs  . Financial resource strain: None  . Food insecurity - worry: None  . Food insecurity - inability: None  . Transportation needs - medical: None  . Transportation needs - non-medical: None  Occupational History  . 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 Smoking status: Never Smoker  . Smokeless tobacco: Current User    Types: Chew  Substance and Sexual Activity  . Alcohol use: No  . Drug use:  No  . Sexual activity: None  Other Topics Concern  . None  Social History Narrative   Patient lives with one of his daughters in a one story home.  Has 4 children.  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  Education: high school.      Family History: The patient's family history includes CAD in his daughter and son; Cancer in his mother; Colon cancer in his mother and son; Diabetes in his father; Heart attack in his father; Heart disease in his daughter and father. ROS:   Please see the history of present illness.    All other systems reviewed and are negative.  EKGs/Labs/Other Studies Reviewed:    The following studies were reviewed today:   Recent Labs: 07/11/2017: BUN 14; Creatinine, Ser 1.26; Hemoglobin 11.6; Platelets 179; Potassium 3.8; Sodium 137  Recent Lipid Panel    Component Value Date/Time   CHOL 198 05/31/2015 0842   TRIG 136 05/31/2015 0842   HDL 38 (L) 05/31/2015 0842   CHOLHDL 5.2 05/31/2015 0842   VLDL 27 05/31/2015 0842   LDLCALC 133 (H) 05/31/2015 0842     Physical Exam:    VS:  Ht 6' (1.829 m)   Wt 184 lb (83.5 kg)   BMI 24.95 kg/m     Wt Readings from Last 3 Encounters:  07/18/17 184 lb (83.5 kg)  07/11/17 179 lb 9.6 oz (81.5 kg)  06/26/17 184 lb (83.5 kg)     GEN:  Well nourished, well developed in no acute distress HEENT: Normal NECK: No JVD; No carotid bruits LYMPHATICS: No lymphadenopathy CARDIAC: RRR, no murmurs, rubs, gallops RESPIRATORY:  Clear to auscultation without rales, wheezing or rhonchi  ABDOMEN: Soft, non-tender, non-distended MUSCULOSKELETAL:  No edema; No deformity  SKIN: Warm and dry NEUROLOGIC:  Alert and oriented x 3 PSYCHIATRIC:  Normal affect    Signed, [name], MD  07/18/2017 3:46 PM    [facility]

## 2017-07-24 ENCOUNTER — Ambulatory Visit (INDEPENDENT_AMBULATORY_CARE_PROVIDER_SITE_OTHER): Payer: Medicare Other | Admitting: *Deleted

## 2017-07-24 DIAGNOSIS — I4892 Unspecified atrial flutter: Secondary | ICD-10-CM

## 2017-07-24 DIAGNOSIS — I495 Sick sinus syndrome: Secondary | ICD-10-CM

## 2017-07-24 DIAGNOSIS — Z950 Presence of cardiac pacemaker: Secondary | ICD-10-CM | POA: Diagnosis not present

## 2017-07-24 LAB — CUP PACEART INCLINIC DEVICE CHECK
Battery Remaining Longevity: 95 mo
Brady Statistic AP VP Percent: 0.04 %
Brady Statistic AS VP Percent: 96.12 %
Brady Statistic RV Percent Paced: 96.17 %
Date Time Interrogation Session: 20181226155749
Implantable Lead Implant Date: 20171212
Implantable Lead Location: 753860
Implantable Lead Model: 5076
Implantable Pulse Generator Implant Date: 20171212
Lead Channel Impedance Value: 361 Ohm
Lead Channel Impedance Value: 494 Ohm
Lead Channel Pacing Threshold Amplitude: 0.75 V
Lead Channel Sensing Intrinsic Amplitude: 3.125 mV
Lead Channel Setting Pacing Amplitude: 2.5 V
Lead Channel Setting Sensing Sensitivity: 2 mV
MDC IDC LEAD IMPLANT DT: 20171212
MDC IDC LEAD LOCATION: 753859
MDC IDC MSMT BATTERY VOLTAGE: 3.01 V
MDC IDC MSMT LEADCHNL RA IMPEDANCE VALUE: 323 Ohm
MDC IDC MSMT LEADCHNL RA IMPEDANCE VALUE: 437 Ohm
MDC IDC MSMT LEADCHNL RV PACING THRESHOLD PULSEWIDTH: 0.4 ms
MDC IDC MSMT LEADCHNL RV SENSING INTR AMPL: 19.125 mV
MDC IDC SET LEADCHNL RA PACING AMPLITUDE: 3.5 V
MDC IDC SET LEADCHNL RV PACING PULSEWIDTH: 0.4 ms
MDC IDC STAT BRADY AP VS PERCENT: 0 %
MDC IDC STAT BRADY AS VS PERCENT: 3.83 %
MDC IDC STAT BRADY RA PERCENT PACED: 0.04 %

## 2017-07-24 NOTE — Progress Notes (Signed)
Wound check appointment, s/p RA lead revision on 07/11/17. Steri-strips removed. Wound without redness or edema. Incision edges approximated, wound well healed. Normal device function. Thresholds, sensing, and impedances consistent with implant measurements, R-wave amplitude trend variable. RA output programmed at 3.5V with auto capture programmed on for extra safety margin until 3 month visit; RV output programmed at chronic settings, min output increased to 2.5V per protocol. RA/RV lead monitor reprogrammed to adaptive and RA min output increased to 2.0V per protocol. Histogram distribution appropriate for patient and level of activity. 100% AT/AF burden since 07/12/17, A-flutter per EGMs, no OAC, hx ICH, Dr. [name] following burden and will reassess per notes. No high ventricular rates noted. Patient educated about wound care, arm mobility, lifting restrictions, and Carelink monitor. Carelink transfer requested from [facility]. ROV with WC/HP on 10/16/17.

## 2017-09-02 ENCOUNTER — Telehealth: Payer: Self-pay | Admitting: Neurology

## 2017-09-02 NOTE — Telephone Encounter (Signed)
LMOM letting them know to contact the pharmacy. This was sent to them in December with 5 refills.

## 2017-09-02 NOTE — Telephone Encounter (Signed)
Pt needs a refill of Entacapone called in

## 2017-09-05 ENCOUNTER — Other Ambulatory Visit: Payer: Self-pay

## 2017-09-05 DIAGNOSIS — I959 Hypotension, unspecified: Secondary | ICD-10-CM

## 2017-09-05 MED ORDER — FLUDROCORTISONE ACETATE 0.1 MG PO TABS: 0.1000 mg | ORAL_TABLET | Freq: Every day | ORAL | 11 refills | Status: AC

## 2017-09-05 NOTE — Telephone Encounter (Signed)
Pts daughter calling stating that [facility] in [place] has [facility].  [facility]  Pts daughter verbalized understanding.

## 2017-09-19 DIAGNOSIS — R970 Elevated carcinoembryonic antigen [CEA]: Secondary | ICD-10-CM

## 2017-09-19 DIAGNOSIS — Z9049 Acquired absence of other specified parts of digestive tract: Secondary | ICD-10-CM

## 2017-09-19 DIAGNOSIS — D509 Iron deficiency anemia, unspecified: Secondary | ICD-10-CM

## 2017-09-19 DIAGNOSIS — Z85030 Personal history of malignant carcinoid tumor of large intestine: Secondary | ICD-10-CM

## 2017-09-30 NOTE — Progress Notes (Deleted)
Kenneth Macias was seen today in the movement disorders clinic for neurologic consultation at the request of [name] found.  The consultation is for the evaluation of PD.  Pt previously under the care of Dr. [name] and Dr. [name] and I have reviewed those records. This patient is accompanied in the office by his daughter who supplements the history.  Pt was dx with PD about 7 years ago (about 2011).  His first sx was trouble walking; pt didn't even notice it but Dr. [name] noted it.  His first medication was carbidopa/levodopa.  He was on carbidopa/levodopa 25/250, 2 po bid and azilect.  He began to have orthostasis and syncope at the end of 2017.  At that time his carbidopa/levodopa 25/250 was changed to rytary 245 mg, 2 po tid. it appears via records that he had hallucinations, falls and more episodes of REM behavior disorder with this and it was decreased to 1 tablet 3 times per day (7:30am/3:30/bedtime).  He did better with this.  He takes comtan with this. However, orthostasis continued.  He was started on Northera at low dose, 100 mg 3 times per day in November, 2017.  They are unsure of why that was d/c.  In fact they aren't even sure that he was ever on that one.  The patient is currently on both Florinef and midodrine, which are managed by cardiology.  He also had a pacemaker implanted.  In the midst of all this, the patient was diagnosed with adenocarcinoma of the colon.  He had a resection and is now being managed by medical oncology.     10/01/17 update: Patient is seen today in follow-up.  He is accompanied by his daughter who supplements the history.  I changed his Rytary dosing to Rytary 245 mg, 2 tablets at 7 AM/11 AM/4 PM.  He is on entacapone with each of these dosages.  Pt denies falls.  Pt denies lightheadedness, near syncope.    Mood has been good.  Quetiapine was started last visit for hallucinations but he never ended up taking the medication.  They stated that they had discussed  with the cardiologist, and decided to hold.    PREVIOUS MEDICATIONS: Sinemet and Comtan; ? northera  ALLERGIES:  No Known Allergies  CURRENT MEDICATIONS:  Outpatient Encounter Medications as of 10/01/2017  Medication Sig  . Carbidopa-Levodopa ER 61.25-245 MG CPCR Take 1 tablet by mouth 3 (three) times daily.  . cholecalciferol (VITAMIN D) 1000 UNITS tablet Take 1,000 Units by mouth daily.  . diphenhydramine-acetaminophen (TYLENOL PM) 25-500 MG TABS tablet Take 1 tablet by mouth at bedtime as needed (for sleep).  . entacapone (COMTAN) 200 MG tablet Take 1 tablet (200 mg total) by mouth 3 (three) times daily.  . fludrocortisone (FLORINEF) 0.1 MG tablet Take 1 tablet (0.1 mg total) by mouth daily.  Marland Kitchen FLUoxetine (PROZAC) 10 MG tablet Take 10 mg by mouth at bedtime.   . hydrocortisone cream 1 % Apply 1 application topically as needed for itching.  . isosorbide mononitrate (IMDUR) 30 MG 24 hr tablet Take 1 tablet (30 mg total) by mouth daily.  . midodrine (PROAMATINE) 10 MG tablet Take 10 mg by mouth 3 (three) times daily.  . pantoprazole (PROTONIX) 40 MG tablet Take 40 mg by mouth daily.  . ranolazine (RANEXA) 1000 MG SR tablet Take 1 tablet (1,000 mg total) by mouth 2 (two) times daily.  . saxagliptin HCl (ONGLYZA) 5 MG TABS tablet Take 5 mg by mouth daily.  No facility-administered encounter medications on file as of 10/01/2017.     PAST MEDICAL HISTORY:   Past Medical History:  Diagnosis Date  . Adenocarcinoma of colon ([place]) 01/03/2017  . Alzheimer's disease 01/06/2016  . Atrial flutter ([place]) 12/18/2016  . Chest pain 05/29/2015  . Chronic kidney disease 05/31/2015  . Coronary artery disease involving native coronary artery    Overview:  Overview:  1. PCI and BMS to RCA 2011 2. PCI and DES to RCA 07/30/11  Cardiac cath 05/31/15: Cath 05/31/15 Conclusion  1. Mid RCA to Dist RCA lesion, 20% stenosed. The lesion was previously treated with a stent (unknown type). 2. Prox RCA lesion, 20% stenosed.  3. Mid RCA lesion, 20% stenosed. 4. 1st RPLB lesion, 99% stenosed. 5. Ost RPDA lesion, 60% stenosed. 6. LM lesion, 40% stenosed. 7. Ost Cx to Prox Cx lesion, 40% stenosed. 8. 2nd Mrg lesion, 60% stenosed. 9. Ost LAD to Mid LAD lesion, 30% stenosed. 10. Mid LAD to Dist LAD lesion, 60% stenosed. 11. Ost 2nd Diag to 2nd Diag lesion, 99% stenosed. 12. Ost 1st Diag to 1st Diag lesion, 99% stenosed.  1. Unstable angina 2. The right coronary artery is diffusely calcified. There is mild disease in the proximal and mid segments. The distal stent is patent. Beyond the stent the posterolateral branches are small in caliber and diffusely diseased. These vessels are too small for PCI.  3. The Circumflex terminates into an obtuse margi  . DM (diabetes mellitus) ([place]) 05/31/2015  . Fall 01/06/2016  . Hyperlipidemia 05/31/2015  . Hypertension   . Hypertensive kidney disease with stage 3 chronic kidney disease ([place]) 04/25/2015  . Leukocytosis 05/11/2016  . Myocardial infarction ([place]) 07/30/2014   Overview:  x2  . NSTEMI (non-ST elevated myocardial infarction) ([place]) 05/31/2015  . Orthostatic hypotension 05/11/2016  . Parkinson disease ([place]) 05/31/2015  . Paroxysmal atrial flutter ([place]) 06/11/2016  . Subarachnoid hemorrhage ([place]) 01/06/2016  . Syncope 01/09/2016  . Type 2 diabetes mellitus without complication, without long-term current use of insulin ([place]) 06/10/2016  . Unstable angina pectoris ([place]) 05/31/2015    PAST SURGICAL HISTORY:   Past Surgical History:  Procedure Laterality Date  . CARDIAC CATHETERIZATION N/A 05/31/2015   Procedure: Left Heart Cath and Coronary Angiography;  Surgeon: Burnell Blanks, MD;  Location: [facility];  Service: Cardiovascular;  Laterality: N/A;  . COLON RESECTION    . COLONOSCOPY    . CORONARY ANGIOPLASTY WITH STENT PLACEMENT    . HAND SURGERY    . PACEMAKER REVISION N/A 07/11/2017   Procedure: PACEMAKER REVISION;  Surgeon: Constance Haw, MD;  Location: [facility];  Service: Cardiovascular;  Laterality: N/A;    SOCIAL HISTORY:   Social History   Socioeconomic History  . Marital status: Widowed    Spouse name: Not on file  . Number of children: 4  . Years of education: 4  . Highest education level: Not on file  Social Needs  . Financial resource strain: Not on file  . Food insecurity - worry: Not on file  . Food insecurity - inability: Not on file  . Transportation needs - medical: Not on file  . Transportation needs - non-medical: Not on file  Occupational History  . 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 Smoking status: Never Smoker  . Smokeless tobacco: Current User    Types: Chew  Substance and Sexual Activity  . Alcohol use:  No  . Drug use: No  . Sexual activity: Not on file  Other Topics Concern  . Not on file  Social History Narrative   Patient lives with one of his daughters in a one story home.  Has 4 children.  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  Education: high school.     FAMILY HISTORY:   Family Status  Relation Name Status  . Mother  Deceased  . Father  Deceased  . Son  Alive  . Daughter 3 Alive  . Sister  Alive    ROS:  A complete 10 system review of systems was obtained and was unremarkable apart from what is mentioned above.  PHYSICAL EXAMINATION:    VITALS:   There were no vitals filed for this visit.  GEN:  The patient appears stated age and is in NAD. HEENT:  Normocephalic, atraumatic.  The mucous membranes are moist. The superficial temporal arteries are without ropiness or tenderness. CV:  RRR Lungs:  CTAB Neck/HEME:  There are no carotid bruits bilaterally.  Neurological examination:  Orientation: The patient is alert and oriented x3.  Remote memory intact.  Some trouble with recent memory.  Able to relay most of medical hx but not meds. Cranial nerves: There is good facial symmetry.  There is blepharospasm.   Pupils  are equal round and reactive to light bilaterally. Fundoscopic exam is attempted but the disc margins are not well visualized bilaterally.  Extraocular muscles are intact. The visual fields are full to confrontational testing. The speech is fluent and clear.  He is hypophonic.  Soft palate rises symmetrically and there is no tongue deviation. Hearing is intact to conversational tone. Sensation: Sensation is intact to light and pinprick throughout (facial, trunk, extremities). Vibration is intact at the bilateral big toe. There is no extinction with double simultaneous stimulation. There is no sensory dermatomal level identified. Motor: Strength is 5/5 in the bilateral upper and lower extremities.   Shoulder shrug is equal and symmetric.  There is no pronator drift. Deep tendon reflexes: Deep tendon reflexes are 1/4 at the bilateral biceps, triceps, brachioradialis, patella and achilles. Plantar responses are downgoing bilaterally.  Movement examination: Tone: There is normal tone in the bilateral upper extremities.  The tone in the lower extremities is normal.  Abnormal movements: There is no tremor noted.  There is blepharospasm. Coordination:  There is no significant decremation with RAM's, but he does have rather significant apraxia with most rapid alternating movements in the upper extremities bilaterally. Gait and Station: The patient has difficulty arising out of a deep-seated chair without the use of the hands.  He is given a gait belt.  He pushes off of the chair and requires minor assistance to get up.  He is given a walker.  With a walker, stride length is actually quite good.  Without the walker, he festinate's.   ASSESSMENT/PLAN:  1.  Parkinsons disease, dx in approximate 2011.  His Parkinson's disease has been complicated by severe orthostasis, memory change, hallucinations.  -We discussed the diagnosis as well as pathophysiology of the disease.  We discussed treatment options as well as  prognostic indicators.  Patient education was provided.  -We discussed that it used to be thought that levodopa would increase risk of melanoma but now it is believed that Parkinsons itself likely increases risk of melanoma. he is to get regular skin checks.  -We decided to move his rytary from 245 mg at 7 AM/3:30 PM/bedtime to 7 AM/11  AM/4 PM.   risks, benefits, side effects and alternative therapies were discussed.  The opportunity to ask questions was given and they were answered to the best of my ability.  The patient expressed understanding and willingness to follow the outlined treatment protocols.  -Patient will continue his Comtan, 200 mg, 3 times a day with Rytary.  I may discontinue that in the future for ease of dosing.  I am not sure that he really needs that with the right tarry.  2.  Blepharospasm  -This is not bothersome for him.  No treatment was recommended.  3.  Insomnia  -As above, we discussed various medication options.  We ultimately decided on Seroquel.  We had discussed mirtazapine as well.  We did decide to discontinue his Prozac.  Neither patient nor his daughter thought that depression was an issue and stated that the Prozac was originally for sleep.  4.  Adenocarcinoma of the colon  -Status post resection.  Now being managed by oncology.  5.  Orthostatic hypotension  -on midodrine and florinef.  Discussed concept of permissive hypertension.  Would not recommend treating high blood pressure unless systolic blood pressure consistently in the 190s-200s.  Patients daughter states that he was told the same by his cardiologist.  6.  Follow up is anticipated in the next few months, sooner should new neurologic issues arise.  Above recorded time did not include the 40 min of record review which was detailed above, which was non face to face time.   Cc:  Charletta Cousin, MD (Inactive)

## 2017-10-01 ENCOUNTER — Ambulatory Visit: Payer: Medicare Other | Admitting: Neurology

## 2017-10-16 ENCOUNTER — Encounter: Payer: Medicare Other | Admitting: Cardiology

## 2017-10-17 ENCOUNTER — Ambulatory Visit: Payer: Medicare Other | Admitting: Cardiology

## 2017-10-24 ENCOUNTER — Ambulatory Visit: Payer: Medicare Other | Admitting: Cardiology

## 2017-10-24 NOTE — Progress Notes (Deleted)
Cardiology Office Note:    Date:  10/24/2017   ID:  [name], DOB 1934-06-09, MRN [id number]  PCP:  Charletta Cousin, MD (Inactive)  Cardiologist:  [name], MD    Referring MD: [name] found    ASSESSMENT:    1. Orthostatic hypotension   2. Paroxysmal atrial flutter (HCC)   3. Pacemaker   4. Hypertensive kidney disease with stage 3 chronic kidney disease (HCC)   5. Parkinson disease ([place])    PLAN:    In order of problems listed above:  1. ***   Next appointment: ***   Medication Adjustments/Labs and Tests Ordered: Current medicines are reviewed at length with the patient today.  Concerns regarding medicines are outlined above.  No orders of the defined types were placed in this encounter.  No orders of the defined types were placed in this encounter.   No chief complaint on file.   History of Present Illness:    Kenneth Macias is a 84 y.o. male with a hx of symptomatic severe orthostatic hypotension,CAD, atrial flutter, previous traumatic ICH,essential hypertension , Parkinson's and a dual chamber pacemaker for bradycardia and syncopewith failure of the atrial lead andrevision by dr Curt Bears last seen 07/18/17. Compliance with diet, lifestyle and medications: *** Past Medical History:  Diagnosis Date  . Adenocarcinoma of colon ([place]) 01/03/2017  . Alzheimer's disease 01/06/2016  . Atrial flutter ([place]) 12/18/2016  . Chest pain 05/29/2015  . Chronic kidney disease 05/31/2015  . Coronary artery disease involving native coronary artery    Overview:  Overview:  1. PCI and BMS to RCA 2011 2. PCI and DES to RCA 07/30/11  Cardiac cath 05/31/15: Cath 05/31/15 Conclusion  1. Mid RCA to Dist RCA lesion, 20% stenosed. The lesion was previously treated with a stent (unknown type). 2. Prox RCA lesion, 20% stenosed. 3. Mid RCA lesion, 20% stenosed. 4. 1st RPLB lesion, 99% stenosed. 5. Ost RPDA lesion, 60% stenosed. 6. LM lesion, 40% stenosed. 7. Ost Cx to Prox Cx lesion, 40%  stenosed. 8. 2nd Mrg lesion, 60% stenosed. 9. Ost LAD to Mid LAD lesion, 30% stenosed. 10. Mid LAD to Dist LAD lesion, 60% stenosed. 11. Ost 2nd Diag to 2nd Diag lesion, 99% stenosed. 12. Ost 1st Diag to 1st Diag lesion, 99% stenosed.  1. Unstable angina 2. The right coronary artery is diffusely calcified. There is mild disease in the proximal and mid segments. The distal stent is patent. Beyond the stent the posterolateral branches are small in caliber and diffusely diseased. These vessels are too small for PCI.  3. The Circumflex terminates into an obtuse margi  . DM (diabetes mellitus) ([place]) 05/31/2015  . Fall 01/06/2016  . Hyperlipidemia 05/31/2015  . Hypertension   . Hypertensive kidney disease with stage 3 chronic kidney disease ([place]) 04/25/2015  . Leukocytosis 05/11/2016  . Myocardial infarction ([place]) 07/30/2014   Overview:  x2  . NSTEMI (non-ST elevated myocardial infarction) ([place]) 05/31/2015  . Orthostatic hypotension 05/11/2016  . Parkinson disease ([place]) 05/31/2015  . Paroxysmal atrial flutter ([place]) 06/11/2016  . Subarachnoid hemorrhage ([place]) 01/06/2016  . Syncope 01/09/2016  . Type 2 diabetes mellitus without complication, without long-term current use of insulin ([place]) 06/10/2016  . Unstable angina pectoris ([place]) 05/31/2015    Past Surgical History:  Procedure Laterality Date  . CARDIAC CATHETERIZATION N/A 05/31/2015   Procedure: Left Heart Cath and Coronary Angiography;  Surgeon: Burnell Blanks, MD;  Location: [facility];  Service: Cardiovascular;  Laterality: N/A;  .  COLON RESECTION    . COLONOSCOPY    . CORONARY ANGIOPLASTY WITH STENT PLACEMENT    . HAND SURGERY    . PACEMAKER REVISION N/A 07/11/2017   Procedure: PACEMAKER REVISION;  Surgeon: Constance Haw, MD;  Location: [facility];  Service: Cardiovascular;  Laterality: N/A;    Current Medications: No outpatient medications have been marked as taking for the 10/25/17 encounter (Appointment) with Richardo Priest, MD.     Allergies:   Patient has no known allergies.   Social History   Socioeconomic History  . Marital status: Widowed    Spouse name: Not on file  . Number of children: 4  . Years of education: 73  . Highest education level: Not on file  Occupational History  . 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 Financial resource strain: Not on file  . Food insecurity:    Worry: Not on file    Inability: Not on file  . Transportation needs:    Medical: Not on file    Non-medical: Not on file  Tobacco Use  . Smoking status: Never Smoker  . Smokeless tobacco: Current User    Types: Chew  Substance and Sexual Activity  . Alcohol use: No  . Drug use: No  . Sexual activity: Not on file  Lifestyle  . Physical activity:    Days per week: Not on file    Minutes per session: Not on file  . Stress: Not on file  Relationships  . Social connections:    Talks on phone: Not on file    Gets together: Not on file    Attends religious service: Not on file    Active member of club or organization: Not on file    Attends meetings of clubs or organizations: Not on file    Relationship status: Not on file  Other Topics Concern  . Not on file  Social History Narrative   Patient lives with one of his daughters in a one story home.  Has 4 children.  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  Education: high school.      Family History: The patient's ***family history includes CAD in his daughter and son; Cancer in his mother; Colon cancer in his mother and son; Diabetes in his father; Heart attack in his father; Heart disease in his daughter and father. ROS:   Please see the history of present illness.    All other systems reviewed and are negative.  EKGs/Labs/Other Studies Reviewed:    The following studies were reviewed today:  EKG:  EKG ordered today.  The ekg ordered today demonstrates ***  Recent Labs: 07/11/2017:  BUN 14; Creatinine, Ser 1.26; Hemoglobin 11.6; Platelets 179; Potassium 3.8; Sodium 137  Recent Lipid Panel    Component Value Date/Time   CHOL 198 05/31/2015 0842   TRIG 136 05/31/2015 0842   HDL 38 (L) 05/31/2015 0842   CHOLHDL 5.2 05/31/2015 0842   VLDL 27 05/31/2015 0842   LDLCALC 133 (H) 05/31/2015 0842    Physical Exam:    VS:  There were no vitals taken for this visit.    Wt Readings from Last 3 Encounters:  07/18/17 184 lb (83.5 kg)  07/11/17 179 lb 9.6 oz (81.5 kg)  06/26/17 184 lb (83.5 kg)     GEN: *** Well nourished, well developed in no acute distress HEENT: Normal NECK: No JVD; No carotid  bruits LYMPHATICS: No lymphadenopathy CARDIAC: ***RRR, no murmurs, rubs, gallops RESPIRATORY:  Clear to auscultation without rales, wheezing or rhonchi  ABDOMEN: Soft, non-tender, non-distended MUSCULOSKELETAL:  No edema; No deformity  SKIN: Warm and dry NEUROLOGIC:  Alert and oriented x 3 PSYCHIATRIC:  Normal affect    Signed, [name], MD  10/24/2017 5:56 PM    [facility]

## 2017-10-25 ENCOUNTER — Ambulatory Visit: Payer: Medicare Other | Admitting: Cardiology

## 2017-10-30 NOTE — Progress Notes (Signed)
Kenneth Macias was seen today in the movement disorders clinic for neurologic consultation at the request of [name] found.  The consultation is for the evaluation of PD.  Pt previously under the care of Dr. [name] and Dr. [name] and I have reviewed those records. This patient is accompanied in the office by his daughter who supplements the history.  Pt was dx with PD about 7 years ago (about 2011).  His first sx was trouble walking; pt didn't even notice it but Dr. [name] noted it.  His first medication was carbidopa/levodopa.  He was on carbidopa/levodopa 25/250, 2 po bid and azilect.  He began to have orthostasis and syncope at the end of 2017.  At that time his carbidopa/levodopa 25/250 was changed to rytary 245 mg, 2 po tid. it appears via records that he had hallucinations, falls and more episodes of REM behavior disorder with this and it was decreased to 1 tablet 3 times per day (7:30am/3:30/bedtime).  He did better with this.  He takes comtan with this. However, orthostasis continued.  He was started on Northera at low dose, 100 mg 3 times per day in November, 2017.  They are unsure of why that was d/c.  In fact they aren't even sure that he was ever on that one.  The patient is currently on both Florinef and midodrine, which are managed by cardiology.  He also had a pacemaker implanted.  In the midst of all this, the patient was diagnosed with adenocarcinoma of the colon.  He had a resection and is now being managed by medical oncology.     10/31/17 update: Patient is seen today in follow-up.  He is accompanied by his daughter who supplements the history.  I changed his Rytary dosing to Rytary 245 mg, 1 tablets at 7 AM/11 AM/4 PM but he is still taking last one at bedtime.  Seen at 4pm today and only taken 1 dose at 10am.  He is on entacapone with each of these dosages. More freezing.  Trouble with feeling weak.  More trouble over last month and had labs by Charletta Cousin, MD (Inactive).  States  that pt couldn't give urine sample so attempting to collect at home.   Lives with daughter who works but other daughter stays with him during the day.   Pt denies falls.  Pt denies lightheadedness, near syncope.    Mood has been good.  Quetiapine was started last visit for hallucinations but he never ended up taking the medication.  They stated that they had discussed with the cardiologist, and decided to hold.  Daughter concerned about pt not being on medication for dementia.  The records that were made available to me were reviewed.  In hospital in December as had right atrial lead dislodged on recently install PPM.    PREVIOUS MEDICATIONS: Sinemet and Comtan; ? northera  ALLERGIES:  No Known Allergies  CURRENT MEDICATIONS:  Outpatient Encounter Medications as of 10/31/2017  Medication Sig  . carbidopa-levodopa (SINEMET IR) 25-100 MG tablet Take 2 tablets by mouth 3 (three) times daily.  . cholecalciferol (VITAMIN D) 1000 UNITS tablet Take 1,000 Units by mouth daily.  . diphenhydramine-acetaminophen (TYLENOL PM) 25-500 MG TABS tablet Take 1 tablet by mouth at bedtime as needed (for sleep).  . entacapone (COMTAN) 200 MG tablet Take 1 tablet (200 mg total) by mouth 3 (three) times daily.  . fludrocortisone (FLORINEF) 0.1 MG tablet Take 1 tablet (0.1 mg total) by mouth daily.  Marland Kitchen  FLUoxetine (PROZAC) 10 MG tablet Take 10 mg by mouth at bedtime.   . hydrocortisone cream 1 % Apply 1 application topically as needed for itching.  . isosorbide mononitrate (IMDUR) 30 MG 24 hr tablet Take 1 tablet (30 mg total) by mouth daily.  . midodrine (PROAMATINE) 10 MG tablet Take 10 mg by mouth 3 (three) times daily.  . pantoprazole (PROTONIX) 40 MG tablet Take 40 mg by mouth daily.  . ranolazine (RANEXA) 1000 MG SR tablet Take 1 tablet (1,000 mg total) by mouth 2 (two) times daily.  . saxagliptin HCl (ONGLYZA) 5 MG TABS tablet Take 5 mg by mouth daily.  . [DISCONTINUED] Carbidopa-Levodopa ER 61.25-245 MG CPCR Take  1 tablet by mouth 3 (three) times daily.   No facility-administered encounter medications on file as of 10/31/2017.     PAST MEDICAL HISTORY:   Past Medical History:  Diagnosis Date  . Adenocarcinoma of colon ([place]) 01/03/2017  . Alzheimer's disease 01/06/2016  . Atrial flutter ([place]) 12/18/2016  . Chest pain 05/29/2015  . Chronic kidney disease 05/31/2015  . Coronary artery disease involving native coronary artery    Overview:  Overview:  1. PCI and BMS to RCA 2011 2. PCI and DES to RCA 07/30/11  Cardiac cath 05/31/15: Cath 05/31/15 Conclusion  1. Mid RCA to Dist RCA lesion, 20% stenosed. The lesion was previously treated with a stent (unknown type). 2. Prox RCA lesion, 20% stenosed. 3. Mid RCA lesion, 20% stenosed. 4. 1st RPLB lesion, 99% stenosed. 5. Ost RPDA lesion, 60% stenosed. 6. LM lesion, 40% stenosed. 7. Ost Cx to Prox Cx lesion, 40% stenosed. 8. 2nd Mrg lesion, 60% stenosed. 9. Ost LAD to Mid LAD lesion, 30% stenosed. 10. Mid LAD to Dist LAD lesion, 60% stenosed. 11. Ost 2nd Diag to 2nd Diag lesion, 99% stenosed. 12. Ost 1st Diag to 1st Diag lesion, 99% stenosed.  1. Unstable angina 2. The right coronary artery is diffusely calcified. There is mild disease in the proximal and mid segments. The distal stent is patent. Beyond the stent the posterolateral branches are small in caliber and diffusely diseased. These vessels are too small for PCI.  3. The Circumflex terminates into an obtuse margi  . DM (diabetes mellitus) ([place]) 05/31/2015  . Fall 01/06/2016  . Hyperlipidemia 05/31/2015  . Hypertension   . Hypertensive kidney disease with stage 3 chronic kidney disease ([place]) 04/25/2015  . Leukocytosis 05/11/2016  . Myocardial infarction ([place]) 07/30/2014   Overview:  x2  . NSTEMI (non-ST elevated myocardial infarction) ([place]) 05/31/2015  . Orthostatic hypotension 05/11/2016  . Parkinson disease ([place]) 05/31/2015  . Paroxysmal atrial flutter ([place]) 06/11/2016  . Subarachnoid hemorrhage ([place]) 01/06/2016  .  Syncope 01/09/2016  . Type 2 diabetes mellitus without complication, without long-term current use of insulin ([place]) 06/10/2016  . Unstable angina pectoris ([place]) 05/31/2015    PAST SURGICAL HISTORY:   Past Surgical History:  Procedure Laterality Date  . CARDIAC CATHETERIZATION N/A 05/31/2015   Procedure: Left Heart Cath and Coronary Angiography;  Surgeon: Burnell Blanks, MD;  Location: [facility];  Service: Cardiovascular;  Laterality: N/A;  . COLON RESECTION    . COLONOSCOPY    . CORONARY ANGIOPLASTY WITH STENT PLACEMENT    . HAND SURGERY    . PACEMAKER REVISION N/A 07/11/2017   Procedure: PACEMAKER REVISION;  Surgeon: Constance Haw, MD;  Location: [facility];  Service: Cardiovascular;  Laterality: N/A;    SOCIAL HISTORY:   Social History  Socioeconomic History  . Marital status: Widowed    Spouse name: Not on file  . Number of children: 4  . Years of education: 44  . Highest education level: Not on file  Occupational History  . 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 Financial resource strain: Not on file  . Food insecurity:    Worry: Not on file    Inability: Not on file  . Transportation needs:    Medical: Not on file    Non-medical: Not on file  Tobacco Use  . Smoking status: Never Smoker  . Smokeless tobacco: Current User    Types: Chew  Substance and Sexual Activity  . Alcohol use: No  . Drug use: No  . Sexual activity: Not on file  Lifestyle  . Physical activity:    Days per week: Not on file    Minutes per session: Not on file  . Stress: Not on file  Relationships  . Social connections:    Talks on phone: Not on file    Gets together: Not on file    Attends religious service: Not on file    Active member of club or organization: Not on file    Attends meetings of clubs or organizations: Not on file    Relationship status: Not on file  . Intimate partner violence:    Fear of  current or ex partner: Not on file    Emotionally abused: Not on file    Physically abused: Not on file    Forced sexual activity: Not on file  Other Topics Concern  . Not on file  Social History Narrative   Patient lives with one of his daughters in a one story home.  Has 4 children.  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  Education: high school.     FAMILY HISTORY:   Family Status  Relation Name Status  . Mother  Deceased  . Father  Deceased  . Son  Alive  . Daughter 3 Alive  . Sister  Alive    ROS:  A complete 10 system review of systems was obtained and was unremarkable apart from what is mentioned above.  PHYSICAL EXAMINATION:    VITALS:   Vitals:   10/31/17 1555  BP: 120/72  Pulse: 62  SpO2: 98%    GEN:  The patient appears stated age and is in NAD. HEENT:  Normocephalic, atraumatic.  The mucous membranes are moist. The superficial temporal arteries are without ropiness or tenderness.  Pt is drooling CV:  RRR Lungs:  CTAB Neck/HEME:  There are no carotid bruits bilaterally.  Neurological examination:  Orientation: The patient is alert and oriented x2.  He is interactive but relies on daughter for most of hx Cranial nerves: There is good facial symmetry.  There is blepharospasm.     Extraocular muscles are intact. The visual fields are full to confrontational testing. The speech is fluent and clear.  He is hypophonic.  Soft palate rises symmetrically and there is no tongue deviation. Hearing is intact to conversational tone. Sensation: Sensation is intact to light touch throughout Motor: Strength is 5/5 in the bilateral upper and lower extremities.   Shoulder shrug is equal and symmetric.  There is no pronator drift. Deep tendon reflexes: Deep tendon reflexes are 1/4 at the bilateral biceps, triceps, brachioradialis, patella and achilles. Plantar responses are downgoing bilaterally.  Movement examination: Tone: There is normal tone  in the bilateral  upper extremities.  The tone in the lower extremities is normal.  Abnormal movements: There is no tremor noted.  There is blepharospasm. Coordination:  There is no significant decremation with RAM's, but he does have rather significant apraxia with most rapid alternating movements in the upper extremities bilaterally. Gait and Station: The patient has difficulty arising out of the chair.  He is given a gait belt and walker.  He requires some assistance out of the chair.  He has short stepped.  No freezing is noted, but he has poor endurance.  ASSESSMENT/PLAN:  1.  Parkinsons disease, dx in approximate 2011.  His Parkinson's disease has been complicated by severe orthostasis, memory change, hallucinations.  -We discussed the diagnosis as well as pathophysiology of the disease.  We discussed treatment options as well as prognostic indicators.  Patient education was provided.  -We discussed that it used to be thought that levodopa would increase risk of melanoma but now it is believed that Parkinsons itself likely increases risk of melanoma. he is to get regular skin checks.  -spreading rytary out too far.  Also low bioavailability of this medication makes me want to try and stop this medication and restart carbidopa/levodopa 25/100, 2 tablets 3 times per day.  Discussed with patient and his daughter that it should be dosed every 4 hours.  -Patient will continue his Comtan, 200 mg, 3 times a day with levodopa.  I may discontinue that in the future for ease of dosing.   -Will order home health/PT/OT/ST and social work to the house.  2.  Blepharospasm  -This is not bothersome for him.  No treatment was recommended.   3.  Adenocarcinoma of the colon  -Status post resection.  Now being managed by oncology.  4.  Orthostatic hypotension  -on midodrine and florinef.  Discussed concept of permissive hypertension.  Would not recommend treating high blood pressure unless systolic blood pressure consistently  in the 190s-200s.  Patients daughter states that he was told the same by his cardiologist.  5.  Parkinson's dementia  -Daughter very concerned about the fact that he has not on medication for dementia.  I do not want to change to medications at once.  He does have hallucinations, but they were hesitant to start quetiapine, and that is less of the complaint.  The bigger complaint is confusion in the evening as the sun goes down.  I will bring him back in the next 6-8 weeks and we can address this as long as he is tolerating levodopa well.  6. Sialorrhea  -This is commonly associated with PD.  We talked about treatments.  The patient is not a candidate for oral anticholinergic therapy because of increased risk of confusion and falls.  We discussed Botox (type A and B) and 1% atropine drops.  We discusssed that candy like lemon drops can help by stimulating mm of the oropharynx to induce swallowing.  They will try that option   7.  Much greater than 50% of this visit was spent in counseling and coordinating care.  Total face to face time:  30 min   Cc:  Charletta Cousin, MD (Inactive)

## 2017-10-31 ENCOUNTER — Ambulatory Visit (INDEPENDENT_AMBULATORY_CARE_PROVIDER_SITE_OTHER): Payer: Medicare Other | Admitting: Neurology

## 2017-10-31 ENCOUNTER — Encounter: Payer: Self-pay | Admitting: Neurology

## 2017-10-31 VITALS — BP 120/72 | HR 62

## 2017-10-31 DIAGNOSIS — F0280 Dementia in other diseases classified elsewhere without behavioral disturbance: Secondary | ICD-10-CM

## 2017-10-31 DIAGNOSIS — K117 Disturbances of salivary secretion: Secondary | ICD-10-CM

## 2017-10-31 DIAGNOSIS — G20 Parkinson's disease: Secondary | ICD-10-CM | POA: Diagnosis not present

## 2017-10-31 DIAGNOSIS — R441 Visual hallucinations: Secondary | ICD-10-CM | POA: Diagnosis not present

## 2017-10-31 DIAGNOSIS — G20A1 Parkinson's disease without dyskinesia, without mention of fluctuations: Secondary | ICD-10-CM

## 2017-10-31 MED ORDER — CARBIDOPA-LEVODOPA 25-100 MG PO TABS: 2.0000 | ORAL_TABLET | Freq: Three times a day (TID) | ORAL | 1 refills | Status: DC

## 2017-10-31 NOTE — Patient Instructions (Addendum)
1. Stop Rytary  2. Start Carbidopa Levodopa 25/100 IR - 2 tablet every 4 hours three times daily.   3. Keep June follow up.

## 2017-11-04 ENCOUNTER — Telehealth: Payer: Self-pay | Admitting: Neurology

## 2017-11-04 NOTE — Telephone Encounter (Signed)
Called and spoke with Jenny Reichmann, Pt's daughter. She states after the change in medication on 10/31/17 (Sinemat 25/100 2tabs TID) there has been a drastic change. First noticed on 10-27-22, more pronounced on 28-Oct-2022. Pt unable to trx from bed to chair, chair to toilet, having difficulties eating and going to the toilet. Today symptoms have worsened, unable to eat or toilet alone, she is actually having to put medications in his mouth for him. She mentioned the labs from 09/03/17 from PCP Dr. Marcello Moores, showed decreased kidney functions and said was anemic, though stated his hgb was 12.2. Pls advise, thanks.

## 2017-11-04 NOTE — Telephone Encounter (Signed)
They can go back to rytary 245 mg then but make sure that they dose it at 7am/11am/4pm.  I would actually take 2 capsules at 7am

## 2017-11-04 NOTE — Telephone Encounter (Signed)
Spoke with RN at [facility] who saw patient today. She was able to collect a urine on patient to have evaluated at PCP (patient's daughter had been trying for a couple weeks). He is a bit unsteady and verbal order given for her to see patient 2x week for 2 weeks for med management/ RN care.

## 2017-11-04 NOTE — Telephone Encounter (Signed)
Pt's daughter Jenny Reichmann called and said the increase in the carbadopa is not working, making him worse CB# [phone]

## 2017-11-04 NOTE — Telephone Encounter (Signed)
Called and spoke with [place]. Advsd her to change back to rytary 245mg . Advsd of dosing, 2 capsules at 7A, 1 cap at 11A & 4P. Kenneth Macias verbalized understanding. Confirmed that they had rytary, and offered to call in refill if needed. She stated there are 2 refills remaining on the Rx they have.

## 2017-11-06 ENCOUNTER — Telehealth: Payer: Self-pay | Admitting: Neurology

## 2017-11-06 NOTE — Telephone Encounter (Signed)
Is he on oral iron?  It just can decrease rytary absorption.  Its not a contraindication.  Not a problem to take together

## 2017-11-06 NOTE — Telephone Encounter (Signed)
Kenneth Macias with [facility] called for verbal orders for ST - 3x over the next 4 weeks to work on speech/cognitive strategies. Verbal order given.   She also states patient's Rytary/Iron brought up an interaction alert. I don't see where patient reported to [place] that he takes iron. Dr. Carles ColletJuluis Rainier.

## 2017-11-07 NOTE — Progress Notes (Signed)
Cardiology Office Note:    Date:  11/08/2017   ID:  Kenneth Macias, DOB October 01, 1933, MRN [id number]  PCP:  Charletta Cousin, MD (Inactive)  Cardiologist:  [name], MD    Referring MD: [name] found    ASSESSMENT:    1. Orthostatic hypotension   2. Coronary artery disease of native artery of native heart with stable angina pectoris (HCC)   3. Paroxysmal atrial flutter ([place])   4. Pacemaker   5. Parkinson disease ([place])    PLAN:    In order of problems listed above:  1. Stable he has had no documented or symptomatic hypotension we will continue current treatment with his alpha agonist. 2. Stable continue current medical treatment I would not pursue an ischemia evaluation at this time 3. Stable followed in our device clinic I would not electively anticoagulate him with his previous fall intracranial hemorrhage and movement disorder 4.  improved after lead revision followed in our device clinic 5. Unfortunately worsened recently managed by neurology    Next appointment: 6 months   Medication Adjustments/Labs and Tests Ordered: Current medicines are reviewed at length with the patient today.  Concerns regarding medicines are outlined above.  No orders of the defined types were placed in this encounter.  No orders of the defined types were placed in this encounter.   Chief Complaint  Patient presents with  . Follow-up    3 month appt hypotension, pacemaker and atrial flutter    History of Present Illness:    Kenneth Macias is a 84 y.o. male with a hx of symptomatic severe orthostatic hypotension,CAD, atrial flutter previous traumatic ICH,hypertension , Parkinson's and a dual chamber pacemaker for bradycardia and syncopewith failure of the atrial leadand recent revision by dr Curt Bears  with recurrent atrial flutter and CHADS2VASC of 5, last seen by me 07/18/17.   1. Neurogenic orthostatic hypotension ([place])   2. Pacemaker   3. Paroxysmal atrial flutter (HCC)    PLAN:     1. He is markedly improved with a combination of Florinef and Midrin and will continue his current medications.  He has never started support hose or abdominal binder.  He has hypertension supine and the only blood pressure that we will follow him clinically as his standing is very closely supervised at home and has had no recurrent falls. 2. Stable improved after recent lead revision 3. Head atrial flutter in the hospital he has had in the past if it was frequent and symptomatic we could suppress him with an antiarrhythmic drug with his coronary artery disease I think her choices would be limited and likely would produce toxicity or worsening in his orthostatic hypotension as a consequence.  If he does have frequent episodes of atrial arrhythmia we can place him on reduced dose anticoagulant Eliquis which I think would be safe as he is intracranial hemorrhage is old and he has not fallen.  At this time will stand back and await results of his pacemaker download  Compliance with diet, lifestyle and medications: Yes  He has had no hypotension or symptomatic palpitation he feels improved with his pacemaker revision unfortunate his Parkinson's has worsened and he is currently undergoing treatment evaluation and change in therapy.  I spoke with the patient and his daughter about the potential of anticoagulation and will feel that there is more harm than good and will not electively anticoagulant.  Fortunately his CAD is stable having no anginal discomfort. Past Medical History:  Diagnosis Date  . Adenocarcinoma  of colon ([place]) 01/03/2017  . Alzheimer's disease 01/06/2016  . Atrial flutter ([place]) 12/18/2016  . Chest pain 05/29/2015  . Chronic kidney disease 05/31/2015  . Coronary artery disease involving native coronary artery    Overview:  Overview:  1. PCI and BMS to RCA 2011 2. PCI and DES to RCA 07/30/11  Cardiac cath 05/31/15: Cath 05/31/15 Conclusion  1. Mid RCA to Dist RCA lesion, 20% stenosed. The  lesion was previously treated with a stent (unknown type). 2. Prox RCA lesion, 20% stenosed. 3. Mid RCA lesion, 20% stenosed. 4. 1st RPLB lesion, 99% stenosed. 5. Ost RPDA lesion, 60% stenosed. 6. LM lesion, 40% stenosed. 7. Ost Cx to Prox Cx lesion, 40% stenosed. 8. 2nd Mrg lesion, 60% stenosed. 9. Ost LAD to Mid LAD lesion, 30% stenosed. 10. Mid LAD to Dist LAD lesion, 60% stenosed. 11. Ost 2nd Diag to 2nd Diag lesion, 99% stenosed. 12. Ost 1st Diag to 1st Diag lesion, 99% stenosed.  1. Unstable angina 2. The right coronary artery is diffusely calcified. There is mild disease in the proximal and mid segments. The distal stent is patent. Beyond the stent the posterolateral branches are small in caliber and diffusely diseased. These vessels are too small for PCI.  3. The Circumflex terminates into an obtuse margi  . DM (diabetes mellitus) ([place]) 05/31/2015  . Fall 01/06/2016  . Hyperlipidemia 05/31/2015  . Hypertension   . Hypertensive kidney disease with stage 3 chronic kidney disease ([place]) 04/25/2015  . Leukocytosis 05/11/2016  . Myocardial infarction ([place]) 07/30/2014   Overview:  x2  . NSTEMI (non-ST elevated myocardial infarction) ([place]) 05/31/2015  . Orthostatic hypotension 05/11/2016  . Parkinson disease ([place]) 05/31/2015  . Paroxysmal atrial flutter ([place]) 06/11/2016  . Subarachnoid hemorrhage ([place]) 01/06/2016  . Syncope 01/09/2016  . Type 2 diabetes mellitus without complication, without long-term current use of insulin ([place]) 06/10/2016  . Unstable angina pectoris ([place]) 05/31/2015    Past Surgical History:  Procedure Laterality Date  . CARDIAC CATHETERIZATION N/A 05/31/2015   Procedure: Left Heart Cath and Coronary Angiography;  Surgeon: Burnell Blanks, MD;  Location: [facility];  Service: Cardiovascular;  Laterality: N/A;  . COLON RESECTION    . COLONOSCOPY    . CORONARY ANGIOPLASTY WITH STENT PLACEMENT    . HAND SURGERY    . PACEMAKER REVISION N/A 07/11/2017   Procedure: PACEMAKER  REVISION;  Surgeon: Constance Haw, MD;  Location: [facility];  Service: Cardiovascular;  Laterality: N/A;    Current Medications: Current Meds  Medication Sig  . Carbidopa-Levodopa ER (RYTARY) 61.25-245 MG CPCR Take by mouth. TAKES 2 TABLETS IN THE AM, 1 TABLET AT NOON, 1 TABLET IN THE EVENING  . cholecalciferol (VITAMIN D) 1000 UNITS tablet Take 1,000 Units by mouth daily.  . diphenhydramine-acetaminophen (TYLENOL PM) 25-500 MG TABS tablet Take 1 tablet by mouth at bedtime as needed (for sleep).  . entacapone (COMTAN) 200 MG tablet Take 1 tablet (200 mg total) by mouth 3 (three) times daily.  . Ferrous Sulfate (IRON) 325 (65 Fe) MG TABS Take 2 tablets by mouth daily.  . fludrocortisone (FLORINEF) 0.1 MG tablet Take 1 tablet (0.1 mg total) by mouth daily.  Marland Kitchen FLUoxetine (PROZAC) 10 MG tablet Take 10 mg by mouth at bedtime.   . hydrocortisone cream 1 % Apply 1 application topically as needed for itching.  . isosorbide mononitrate (IMDUR) 60 MG 24 hr tablet Take 30 mg by mouth daily.  . pantoprazole (PROTONIX) 40 MG  tablet Take 40 mg by mouth daily.  . ranolazine (RANEXA) 1000 MG SR tablet Take 1 tablet (1,000 mg total) by mouth 2 (two) times daily.     Allergies:   Patient has no known allergies.   Social History   Socioeconomic History  . Marital status: Widowed    Spouse name: Not on file  . Number of children: 4  . Years of education: 54  . Highest education level: Not on file  Occupational History  . 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 Financial resource strain: Not on file  . Food insecurity:    Worry: Not on file    Inability: Not on file  . Transportation needs:    Medical: Not on file    Non-medical: Not on file  Tobacco Use  . Smoking status: Never Smoker  . Smokeless tobacco: Current User    Types: Chew  Substance and Sexual Activity  . Alcohol use: No  . Drug use: No  . Sexual activity: Not on file   Lifestyle  . Physical activity:    Days per week: Not on file    Minutes per session: Not on file  . Stress: Not on file  Relationships  . Social connections:    Talks on phone: Not on file    Gets together: Not on file    Attends religious service: Not on file    Active member of club or organization: Not on file    Attends meetings of clubs or organizations: Not on file    Relationship status: Not on file  Other Topics Concern  . Not on file  Social History Narrative   Patient lives with one of his daughters in a one story home.  Has 4 children.  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  Education: high school.      Family History: The patient's family history includes CAD in his daughter and son; Cancer in his mother; Colon cancer in his mother and son; Diabetes in his father; Heart attack in his father; Heart disease in his daughter and father. ROS:   Please see the history of present illness.    All other systems reviewed and are negative.  EKGs/Labs/Other Studies Reviewed:    The following studies were reviewed today:   Recent Labs: 07/11/2017: BUN 14; Creatinine, Ser 1.26; Hemoglobin 11.6; Platelets 179; Potassium 3.8; Sodium 137  Recent Lipid Panel    Component Value Date/Time   CHOL 198 05/31/2015 0842   TRIG 136 05/31/2015 0842   HDL 38 (L) 05/31/2015 0842   CHOLHDL 5.2 05/31/2015 0842   VLDL 27 05/31/2015 0842   LDLCALC 133 (H) 05/31/2015 0842    Physical Exam:    VS:  BP 132/78 (BP Location: Left Arm, Patient Position: Sitting, Cuff Size: Normal)   Pulse 66   Ht 6' (1.829 m)   Wt 186 lb (84.4 kg)   SpO2 98%   BMI 25.23 kg/m     Wt Readings from Last 3 Encounters:  11/08/17 186 lb (84.4 kg)  07/18/17 184 lb (83.5 kg)  07/11/17 179 lb 9.6 oz (81.5 kg)     GEN:  Well nourished, well developed in no acute distress HEENT: Normal NECK: No JVD; No carotid bruits LYMPHATICS: No lymphadenopathy CARDIAC: RRR, no murmurs, rubs,  gallops RESPIRATORY:  Clear to auscultation without rales, wheezing or rhonchi  ABDOMEN: Soft, non-tender, non-distended MUSCULOSKELETAL:  No edema; No  deformity  SKIN: Warm and dry NEUROLOGIC:  Alert and oriented x 3 PSYCHIATRIC:  Normal affect    Signed, [name], MD  11/08/2017 11:04 AM    [place]

## 2017-11-08 ENCOUNTER — Encounter: Payer: Self-pay | Admitting: Cardiology

## 2017-11-08 ENCOUNTER — Ambulatory Visit: Payer: Medicare Other | Admitting: Cardiology

## 2017-11-08 VITALS — BP 132/78 | HR 66 | Ht 72.0 in | Wt 186.0 lb

## 2017-11-08 DIAGNOSIS — I951 Orthostatic hypotension: Secondary | ICD-10-CM | POA: Diagnosis not present

## 2017-11-08 DIAGNOSIS — I25118 Atherosclerotic heart disease of native coronary artery with other forms of angina pectoris: Secondary | ICD-10-CM

## 2017-11-08 DIAGNOSIS — I4892 Unspecified atrial flutter: Secondary | ICD-10-CM

## 2017-11-08 DIAGNOSIS — G20 Parkinson's disease: Secondary | ICD-10-CM | POA: Diagnosis not present

## 2017-11-08 DIAGNOSIS — Z950 Presence of cardiac pacemaker: Secondary | ICD-10-CM | POA: Diagnosis not present

## 2017-11-08 NOTE — Patient Instructions (Signed)

## 2017-11-12 ENCOUNTER — Telehealth: Payer: Self-pay | Admitting: Neurology

## 2017-11-12 NOTE — Telephone Encounter (Signed)
Sabra from [facility] called in regards to pt and needing verbal orders CB [phone]

## 2017-11-12 NOTE — Telephone Encounter (Signed)
LMOM for Kenneth Macias to call back.

## 2017-11-20 ENCOUNTER — Ambulatory Visit (INDEPENDENT_AMBULATORY_CARE_PROVIDER_SITE_OTHER): Payer: Medicare Other | Admitting: Cardiology

## 2017-11-20 ENCOUNTER — Encounter: Payer: Self-pay | Admitting: Cardiology

## 2017-11-20 VITALS — BP 146/83 | HR 68 | Ht 71.0 in | Wt 191.0 lb

## 2017-11-20 DIAGNOSIS — I495 Sick sinus syndrome: Secondary | ICD-10-CM

## 2017-11-20 DIAGNOSIS — I25118 Atherosclerotic heart disease of native coronary artery with other forms of angina pectoris: Secondary | ICD-10-CM | POA: Diagnosis not present

## 2017-11-20 DIAGNOSIS — I4892 Unspecified atrial flutter: Secondary | ICD-10-CM | POA: Diagnosis not present

## 2017-11-20 DIAGNOSIS — I951 Orthostatic hypotension: Secondary | ICD-10-CM

## 2017-11-20 NOTE — Progress Notes (Signed)
Electrophysiology Office Note   Date:  11/20/2017   ID:  Kenneth Macias, DOB 1934-01-06, MRN [id number]  PCP:  Charletta Cousin, MD (Inactive)  Cardiologist:  [name] Primary Electrophysiologist:  Will Meredith Leeds, MD    Chief Complaint  Patient presents with  . Pacemaker Check    Sick sinus syndrome     History of Present Illness: Kenneth Macias is a 84 y.o. male who is being seen today for the evaluation of pacemaker lead malfunction at the request of Kenneth Macias. Presenting today for electrophysiology evaluation.  He has a history of severe orthostatic hypotension, coronary artery disease, atrial flutter, previous traumatic intracranial hemorrhage, hypertension, Parkinson's disease, and a dual-chamber pacemaker for bradycardia and syncope.  He was found to have an atrial lead dislodgment and thus had atrial lead revision on 07/12/2017.  Today, denies symptoms of palpitations, chest pain, shortness of breath, orthopnea, PND, lower extremity edema, claudication, dizziness, presyncope, syncope, bleeding, or neurologic sequela. The patient is tolerating medications without difficulties.  Feels well today.  He is having quite a bit Macias energy.  He feels much better since his lead revision.    Past Medical History:  Diagnosis Date  . Adenocarcinoma of colon ([place]) 01/03/2017  . Alzheimer's disease 01/06/2016  . Atrial flutter ([place]) 12/18/2016  . Chest pain 05/29/2015  . Chronic kidney disease 05/31/2015  . Coronary artery disease involving native coronary artery    Overview:  Overview:  1. PCI and BMS to RCA 2011 2. PCI and DES to RCA 07/30/11  Cardiac cath 05/31/15: Cath 05/31/15 Conclusion  1. Mid RCA to Dist RCA lesion, 20% stenosed. The lesion was previously treated with a stent (unknown type). 2. Prox RCA lesion, 20% stenosed. 3. Mid RCA lesion, 20% stenosed. 4. 1st RPLB lesion, 99% stenosed. 5. Ost RPDA lesion, 60% stenosed. 6. LM lesion, 40% stenosed. 7. Ost Cx to Prox Cx lesion, 40% stenosed.  8. 2nd Mrg lesion, 60% stenosed. 9. Ost LAD to Mid LAD lesion, 30% stenosed. 10. Mid LAD to Dist LAD lesion, 60% stenosed. 11. Ost 2nd Diag to 2nd Diag lesion, 99% stenosed. 12. Ost 1st Diag to 1st Diag lesion, 99% stenosed.  1. Unstable angina 2. The right coronary artery is diffusely calcified. There is mild disease in the proximal and mid segments. The distal stent is patent. Beyond the stent the posterolateral branches are small in caliber and diffusely diseased. These vessels are too small for PCI.  3. The Circumflex terminates into an obtuse margi  . DM (diabetes mellitus) ([place]) 05/31/2015  . Fall 01/06/2016  . Hyperlipidemia 05/31/2015  . Hypertension   . Hypertensive kidney disease with stage 3 chronic kidney disease ([place]) 04/25/2015  . Leukocytosis 05/11/2016  . Myocardial infarction ([place]) 07/30/2014   Overview:  x2  . NSTEMI (non-ST elevated myocardial infarction) ([place]) 05/31/2015  . Orthostatic hypotension 05/11/2016  . Parkinson disease ([place]) 05/31/2015  . Paroxysmal atrial flutter ([place]) 06/11/2016  . Subarachnoid hemorrhage ([place]) 01/06/2016  . Syncope 01/09/2016  . Type 2 diabetes mellitus without complication, without long-term current use of insulin ([place]) 06/10/2016  . Unstable angina pectoris ([place]) 05/31/2015   Past Surgical History:  Procedure Laterality Date  . CARDIAC CATHETERIZATION N/A 05/31/2015   Procedure: Left Heart Cath and Coronary Angiography;  Surgeon: Burnell Blanks, MD;  Location: [facility];  Service: Cardiovascular;  Laterality: N/A;  . COLON RESECTION    . COLONOSCOPY    . CORONARY ANGIOPLASTY WITH STENT PLACEMENT    . HAND  SURGERY    . PACEMAKER REVISION N/A 07/11/2017   Procedure: PACEMAKER REVISION;  Surgeon: Constance Haw, MD;  Location: [facility];  Service: Cardiovascular;  Laterality: N/A;     Current Outpatient Medications  Medication Sig Dispense Refill  . Carbidopa-Levodopa ER (RYTARY) 61.25-245 MG CPCR Take by mouth. TAKES  2 TABLETS IN THE AM, 1 TABLET AT NOON, 1 TABLET IN THE EVENING    . cholecalciferol (VITAMIN D) 1000 UNITS tablet Take 1,000 Units by mouth daily.    . diphenhydramine-acetaminophen (TYLENOL PM) 25-500 MG TABS tablet Take 1 tablet by mouth at bedtime as needed (for sleep).    . entacapone (COMTAN) 200 MG tablet Take 1 tablet (200 mg total) by mouth 3 (three) times daily. 90 tablet 5  . Ferrous Sulfate (IRON) 325 (65 Fe) MG TABS Take 2 tablets by mouth daily.    . fludrocortisone (FLORINEF) 0.1 MG tablet Take 1 tablet (0.1 mg total) by mouth daily. 30 tablet 11  . FLUoxetine (PROZAC) 10 MG tablet Take 10 mg by mouth at bedtime.     . isosorbide mononitrate (IMDUR) 60 MG 24 hr tablet Take 30 mg by mouth daily.    . pantoprazole (PROTONIX) 40 MG tablet Take 40 mg by mouth daily.    . ranolazine (RANEXA) 1000 MG SR tablet Take 1 tablet (1,000 mg total) by mouth 2 (two) times daily. 60 tablet 11   No current facility-administered medications for this visit.     Allergies:   Patient has no known allergies.   Social History:  The patient  reports that he has never smoked. His smokeless tobacco use includes chew. He reports that he does not drink alcohol or use drugs.   Family History:  The patient's family history includes CAD in his daughter and son; Cancer in his mother; Colon cancer in his mother and son; Diabetes in his father; Heart attack in his father; Heart disease in his daughter and father.    ROS:  Please see the history of present illness.   Otherwise, review of systems is positive for none.   All other systems are reviewed and negative.   PHYSICAL EXAM: VS:  BP (!) 146/83   Pulse 68   Ht 5\' 11"  (1.803 m)   Wt 191 lb (86.6 kg)   SpO2 98%   BMI 26.64 kg/m  , BMI Body mass index is 26.64 kg/m. GEN: Well nourished, well developed, in no acute distress  HEENT: normal  Neck: no JVD, carotid bruits, or masses Cardiac: RRR; no murmurs, rubs, or gallops,no edema  Respiratory:  clear  to auscultation bilaterally, normal work of breathing GI: soft, nontender, nondistended, + BS MS: no deformity or atrophy  Skin: warm and dry, device site well healed Neuro:  Strength and sensation are intact Psych: euthymic mood, full affect  EKG:  EKG is ordered today. Personal review of the ekg ordered  shows atrial flutter, V paced  Personal review of the device interrogation today. Results in [place]: 07/11/2017: BUN 14; Creatinine, Ser 1.26; Hemoglobin 11.6; Platelets 179; Potassium 3.8; Sodium 137    Lipid Panel     Component Value Date/Time   CHOL 198 05/31/2015 0842   TRIG 136 05/31/2015 0842   HDL 38 (L) 05/31/2015 0842   CHOLHDL 5.2 05/31/2015 0842   VLDL 27 05/31/2015 0842   LDLCALC 133 (H) 05/31/2015 0842     Wt Readings from Last 3 Encounters:  11/20/17 191 lb (86.6 kg)  11/08/17 186 lb (84.4 kg)  07/18/17 184 lb (83.5 kg)      Other studies Reviewed: Additional studies/ records that were reviewed today include: TTE 2016  Review of the above records today demonstrates:  - Left ventricle: The cavity size was normal. Wall thickness was   increased in a pattern of moderate LVH. There was focal basal   hypertrophy. Systolic function was normal. The estimated ejection   fraction was in the range of 60% to 65%. Wall motion was normal;   there were no regional wall motion abnormalities. - Mitral valve: Valve area by continuity equation (using LVOT   flow): 2.24 cm^2.  Cath 05/2015  Mid RCA to Dist RCA lesion, 20% stenosed. The lesion was previously treated with a stent (unknown type).  Prox RCA lesion, 20% stenosed.  Mid RCA lesion, 20% stenosed.  1st RPLB lesion, 99% stenosed.  Ost RPDA lesion, 60% stenosed.  LM lesion, 40% stenosed.  Ost Cx to Prox Cx lesion, 40% stenosed.  2nd Mrg lesion, 60% stenosed.  Ost LAD to Mid LAD lesion, 30% stenosed.  Mid LAD to Dist LAD lesion, 60% stenosed.  Ost 2nd Diag to 2nd Diag lesion, 99%  stenosed.  Ost 1st Diag to 1st Diag lesion, 99% stenosed.   1. NSTEMI 2. The right coronary artery is diffusely calcified. There is mild disease in the proximal and mid segments. The distal stent is patent. Beyond the stent the posterolateral branches are small in caliber and diffusely diseased. These vessels are too small for PCI.  3. The Circumflex terminates into an obtuse marginal branch. The obtuse marginal branch has 60% serial stenoses followed by diffuse disease in the small caliber distal segment of the obtuse marginal branch.  4. The LAD is severely calcified. The mid and distal vessel has diffuse 60-70% stenosis. Both Diagonal branches are small in caliber and diffusely disease, too small for PCI.  5. No obvious culprit lesions.  6. No focal targets for PCI   ASSESSMENT AND PLAN:  1.  Coronary artery disease: Being medically managed.  Cath with severe disease.  No chest pain.  2.  Orthostatic hypotension: Early well controlled.  Likely has to do with his Parkinson's.  3.  Sick sinus syndrome: Medtronic dual-chamber pacemaker implanted 07/10/2016 with atrial lead revision 07/12/2017.  Device functioning appropriately.  No changes.  4.  Atrial flutter: Has been in atrial flutter at least since his lead revision.  It is likely that this is been a long standing issue for him.  He is not able to be anticoagulated due to multiple falls and intracranial hematomas.  We will continue current management.  Current medicines are reviewed at length with the patient today.   The patient does not have concerns regarding his medicines.  The following changes were made today:  none  Labs/ tests ordered today include:  Orders Placed This Encounter  Procedures  . EKG 12-Lead   Case discussed with primary cardiology  Disposition:   FU with Will Camnitz 9 months  Signed, Will Meredith Leeds, MD  11/20/2017 2:44 PM     [facility] [address] [place]   [address] [phone] (office) [phone] (fax)

## 2017-11-20 NOTE — Patient Instructions (Signed)
Medication Instructions:  Your physician recommends that you continue on your current medications as directed. Please refer to the Current Medication list given to you today.  *If you need a refill on your cardiac medications before your next appointment, please call your pharmacy*  Labwork: None ordered  Testing/Procedures: None ordered  Follow-Up: Remote monitoring is used to monitor your Pacemaker or ICD from home. This monitoring reduces the number of office visits required to check your device to one time per year. It allows [place] to keep an eye on the functioning of your device to ensure it is working properly. You are scheduled for a device check from home on 02/19/2018. You may send your transmission at any time that day. If you have a wireless device, the transmission will be sent automatically. After your physician reviews your transmission, you will receive a postcard with your next transmission date.  Your physician wants you to follow-up in: 9 months with Dr. Curt Bears.  You will receive a reminder letter in the mail two months in advance. If you don't receive a letter, please call our office to schedule the follow-up appointment.  Thank you for choosing [facility]!!   Trinidad Curet, RN [phone]

## 2017-11-21 LAB — CUP PACEART INCLINIC DEVICE CHECK
Implantable Lead Implant Date: 20171212
Implantable Lead Location: 753859
MDC IDC LEAD IMPLANT DT: 20171212
MDC IDC LEAD LOCATION: 753860
MDC IDC PG IMPLANT DT: 20171212
MDC IDC SESS DTM: 20190425140022

## 2017-11-25 ENCOUNTER — Telehealth: Payer: Self-pay | Admitting: Neurology

## 2017-11-25 MED ORDER — CARBIDOPA-LEVODOPA ER 61.25-245 MG PO CPCR: 1.0000 | ORAL_CAPSULE | ORAL | 5 refills | Status: DC

## 2017-11-25 NOTE — Telephone Encounter (Signed)
Spoke with daughter. They needed tablet amount changed from 90 to 120 tablets, since medication was increased. RX sent to pharmacy.

## 2017-11-25 NOTE — Telephone Encounter (Signed)
*  STAT* If patient is at the pharmacy, call can be transferred to refill team.  1.     Which medications need to be refilled? (please list name of each medication and dose if know) Rytary  2.     Which pharmacy/location (including street and city if local pharmacy) is medication to be sent to?   3.     Do they need a 30 or 90 day supply? Pt left a VM message asking if the prescription can be changed from 90 day supply to a 120 day supply CB# [phone]

## 2017-12-02 ENCOUNTER — Telehealth: Payer: Self-pay | Admitting: Neurology

## 2017-12-02 NOTE — Telephone Encounter (Signed)
Beth called to let Dr. Carles Collet know that patient had fallen in his bedroom and that there were no injuries. She said that PT was aware. Thanks

## 2017-12-02 NOTE — Telephone Encounter (Signed)
FYI

## 2017-12-25 ENCOUNTER — Telehealth: Payer: Self-pay | Admitting: Neurology

## 2017-12-25 NOTE — Telephone Encounter (Signed)
Gerald Stabs, PT with [facility] [phone], called to report patient had a fall this weekend while transferring from wheelchair to a chair. No one was with him. He has a skin tear on hip, but no LOC/did not hit head.

## 2017-12-26 ENCOUNTER — Other Ambulatory Visit: Payer: Self-pay | Admitting: Neurology

## 2017-12-26 MED ORDER — AMBULATORY NON FORMULARY MEDICATION
0 refills | Status: AC
Start: 2017-12-26 — End: ?

## 2017-12-26 NOTE — Progress Notes (Signed)
Kenneth Macias was seen today in the movement disorders clinic for neurologic consultation at the request of [name] found.  The consultation is for the evaluation of PD.  Pt previously under the care of Dr. [name] and Dr. [name] and I have reviewed those records. This patient is accompanied in the office by his daughter who supplements the history.  Pt was dx with PD about 7 years ago (about 2011).  His first sx was trouble walking; pt didn't even notice it but Dr. [name] noted it.  His first medication was carbidopa/levodopa.  He was on carbidopa/levodopa 25/250, 2 po bid and azilect.  He began to have orthostasis and syncope at the end of 2017.  At that time his carbidopa/levodopa 25/250 was changed to rytary 245 mg, 2 po tid. it appears via records that he had hallucinations, falls and more episodes of REM behavior disorder with this and it was decreased to 1 tablet 3 times per day (7:30am/3:30/bedtime).  He did better with this.  He takes comtan with this. However, orthostasis continued.  He was started on Northera at low dose, 100 mg 3 times per day in November, 2017.  They are unsure of why that was d/c.  In fact they aren't even sure that he was ever on that one.  The patient is currently on both Florinef and midodrine, which are managed by cardiology.  He also had a pacemaker implanted.  In the midst of all this, the patient was diagnosed with adenocarcinoma of the colon.  He had a resection and is now being managed by medical oncology.     10/31/17 update: Patient is seen today in follow-up.  He is accompanied by his daughter who supplements the history.  I changed his Rytary dosing to Rytary 245 mg, 1 tablets at 7 AM/11 AM/4 PM but he is still taking last one at bedtime.  Seen at 4pm today and only taken 1 dose at 10am.  He is on entacapone with each of these dosages. More freezing.  Trouble with feeling weak.  More trouble over last month and had labs by Charletta Cousin, MD (Inactive).  States  that pt couldn't give urine sample so attempting to collect at home.   Lives with daughter who works but other daughter stays with him during the day.   Pt denies falls.  Pt denies lightheadedness, near syncope.    Mood has been good.  Quetiapine was started last visit for hallucinations but he never ended up taking the medication.  They stated that they had discussed with the cardiologist, and decided to hold.  Daughter concerned about pt not being on medication for dementia.  The records that were made available to me were reviewed.  In hospital in December as had right atrial lead dislodged on recently install PPM.  12/30/17 update: Patient seen today for follow-up.  He is accompanied by his daughter who supplements the history.  Last visit, I changed him from Rytary 245 mg to carbidopa/levodopa 25/100, 2 tablets 3 times per day, but he did not tolerate that well and went back to Rytary, with a slight increased dose of 2 tablets at 7 AM, 1 tablet at 11 AM, 1 tablet at 4 PM.  He was to continue entacapone with each dose of levodopa.  He may see a hallucination weekly (something scurrying across the floor).    He had some falls and now reports has a wound on the hip.  Haven't been to PCP but  been treating with silvadene and triple abx ointment.  Now washing out with saline water.  Wound started 1 week ago after fall.  About 5 falls since our last visit.  PT called me about some of the falls.  Did have a fall last month in his bedroom.  Didn't get hurt.  Had another fall when transferring from [facility] to regular chair.  PT has been with him in the home.  Cardiology records reviewed since last visit.  On midodrine and florinef for OH.  Overall, the patient states that he really feels good and does not want to significantly change medication.    PREVIOUS MEDICATIONS: Sinemet and Comtan; ? northera  ALLERGIES:  No Known Allergies  CURRENT MEDICATIONS:  Outpatient Encounter Medications as of 12/30/2017  Medication  Sig  . AMBULATORY NON FORMULARY MEDICATION Manual Wheelchair 16x18 full length removable arm rest Removable elevated leg rest 2 inch cusion  DX: G20  . Carbidopa-Levodopa ER (RYTARY) 61.25-245 MG CPCR Take 1 tablet by mouth See admin instructions. TAKES 2 TABLETS IN THE AM, 1 TABLET AT NOON, 1 TABLET IN THE EVENING  . cholecalciferol (VITAMIN D) 1000 UNITS tablet Take 1,000 Units by mouth daily.  . diphenhydramine-acetaminophen (TYLENOL PM) 25-500 MG TABS tablet Take 1 tablet by mouth at bedtime as needed (for sleep).  . entacapone (COMTAN) 200 MG tablet Take 1 tablet (200 mg total) by mouth 3 (three) times daily.  . Ferrous Sulfate (IRON) 325 (65 Fe) MG TABS Take 2 tablets by mouth daily.  . fludrocortisone (FLORINEF) 0.1 MG tablet Take 1 tablet (0.1 mg total) by mouth daily.  Marland Kitchen FLUoxetine (PROZAC) 10 MG tablet Take 10 mg by mouth at bedtime.   . isosorbide mononitrate (IMDUR) 60 MG 24 hr tablet Take 30 mg by mouth daily.  . pantoprazole (PROTONIX) 40 MG tablet Take 40 mg by mouth daily.  . ranolazine (RANEXA) 1000 MG SR tablet Take 1 tablet (1,000 mg total) by mouth 2 (two) times daily.   No facility-administered encounter medications on file as of 12/30/2017.     PAST MEDICAL HISTORY:   Past Medical History:  Diagnosis Date  . Adenocarcinoma of colon ([place]) 01/03/2017  . Alzheimer's disease 01/06/2016  . Atrial flutter ([place]) 12/18/2016  . Chest pain 05/29/2015  . Chronic kidney disease 05/31/2015  . Coronary artery disease involving native coronary artery    Overview:  Overview:  1. PCI and BMS to RCA 2011 2. PCI and DES to RCA 07/30/11  Cardiac cath 05/31/15: Cath 05/31/15 Conclusion  1. Mid RCA to Dist RCA lesion, 20% stenosed. The lesion was previously treated with a stent (unknown type). 2. Prox RCA lesion, 20% stenosed. 3. Mid RCA lesion, 20% stenosed. 4. 1st RPLB lesion, 99% stenosed. 5. Ost RPDA lesion, 60% stenosed. 6. LM lesion, 40% stenosed. 7. Ost Cx to Prox Cx lesion, 40% stenosed.  8. 2nd Mrg lesion, 60% stenosed. 9. Ost LAD to Mid LAD lesion, 30% stenosed. 10. Mid LAD to Dist LAD lesion, 60% stenosed. 11. Ost 2nd Diag to 2nd Diag lesion, 99% stenosed. 12. Ost 1st Diag to 1st Diag lesion, 99% stenosed.  1. Unstable angina 2. The right coronary artery is diffusely calcified. There is mild disease in the proximal and mid segments. The distal stent is patent. Beyond the stent the posterolateral branches are small in caliber and diffusely diseased. These vessels are too small for PCI.  3. The Circumflex terminates into an obtuse margi  . DM (diabetes mellitus) ([place]) 05/31/2015  .  Fall 01/06/2016  . Hyperlipidemia 05/31/2015  . Hypertension   . Hypertensive kidney disease with stage 3 chronic kidney disease ([place]) 04/25/2015  . Leukocytosis 05/11/2016  . Myocardial infarction ([place]) 07/30/2014   Overview:  x2  . NSTEMI (non-ST elevated myocardial infarction) ([place]) 05/31/2015  . Orthostatic hypotension 05/11/2016  . Parkinson disease ([place]) 05/31/2015  . Paroxysmal atrial flutter ([place]) 06/11/2016  . Subarachnoid hemorrhage ([place]) 01/06/2016  . Syncope 01/09/2016  . Type 2 diabetes mellitus without complication, without long-term current use of insulin ([place]) 06/10/2016  . Unstable angina pectoris ([place]) 05/31/2015    PAST SURGICAL HISTORY:   Past Surgical History:  Procedure Laterality Date  . CARDIAC CATHETERIZATION N/A 05/31/2015   Procedure: Left Heart Cath and Coronary Angiography;  Surgeon: Burnell Blanks, MD;  Location: [facility];  Service: Cardiovascular;  Laterality: N/A;  . COLON RESECTION    . COLONOSCOPY    . CORONARY ANGIOPLASTY WITH STENT PLACEMENT    . HAND SURGERY    . PACEMAKER REVISION N/A 07/11/2017   Procedure: PACEMAKER REVISION;  Surgeon: Constance Haw, MD;  Location: [facility];  Service: Cardiovascular;  Laterality: N/A;    SOCIAL HISTORY:   Social History   Socioeconomic History  . Marital status: Widowed    Spouse name: Not on  file  . Number of children: 4  . Years of education: 62  . Highest education level: Not on file  Occupational History  . 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 Financial resource strain: Not on file  . Food insecurity:    Worry: Not on file    Inability: Not on file  . Transportation needs:    Medical: Not on file    Non-medical: Not on file  Tobacco Use  . Smoking status: Never Smoker  . Smokeless tobacco: Current User    Types: Chew  Substance and Sexual Activity  . Alcohol use: No  . Drug use: No  . Sexual activity: Not on file  Lifestyle  . Physical activity:    Days per week: Not on file    Minutes per session: Not on file  . Stress: Not on file  Relationships  . Social connections:    Talks on phone: Not on file    Gets together: Not on file    Attends religious service: Not on file    Active member of club or organization: Not on file    Attends meetings of clubs or organizations: Not on file    Relationship status: Not on file  . Intimate partner violence:    Fear of current or ex partner: Not on file    Emotionally abused: Not on file    Physically abused: Not on file    Forced sexual activity: Not on file  Other Topics Concern  . Not on file  Social History Narrative   Patient lives with one of his daughters in a one story home.  Has 4 children.  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  Education: high school.     FAMILY HISTORY:   Family Status  Relation Name Status  . Mother  Deceased  . Father  Deceased  . Son  Alive  . Daughter 3 Alive  . Sister  Alive    ROS:  Review of Systems  Constitutional: Negative.   HENT: Negative.   Eyes: Negative.   Respiratory: Negative.   Cardiovascular:  Negative.   Gastrointestinal: Negative.   Genitourinary: Negative.   Musculoskeletal: Positive for falls.  Skin: Negative.   Endo/Heme/Allergies: Negative.   Psychiatric/Behavioral: Negative.      PHYSICAL EXAMINATION:    VITALS:   Vitals:   12/30/17 0823  BP: 130/72  Pulse: 84  SpO2: 98%  Weight: 186 lb (84.4 kg)  Height: 6\' 2"  (1.88 m)    GEN:  The patient appears stated age and is in NAD. HEENT:  Normocephalic, atraumatic.  The mucous membranes are moist. The superficial temporal arteries are without ropiness or tenderness. CV:  RRR Lungs:  CTAB Neck/HEME:  There are no carotid bruits bilaterally. Derm:  Stage 1 pressure sore on the L gluteal region.  Neurological examination:  Orientation: The patient is alert and oriented x3. Cranial nerves: There is good facial symmetry. The speech is fluent and clear. Soft palate rises symmetrically and there is no tongue deviation. Hearing is intact to conversational tone. Sensation: Sensation is intact to light touch throughout Motor: Strength is at least antigravity x 4  Movement examination: Tone: There is normal tone in the bilateral upper extremities.  The tone in the lower extremities is normal.  Abnormal movements: There is no tremor noted.  No blepharospasm today Coordination:  There is no significant decremation with RAM's, but he does have rather significant apraxia with most rapid alternating movements in the upper extremities bilaterally. Gait and Station: The patient needs mild help out of chair.  Stands using walker to allow me to view pressure sore but did not ambulate today.  ASSESSMENT/PLAN:  1.  Parkinsons disease, dx in approximate 2011.  His Parkinson's disease has been complicated by severe orthostasis, memory change, hallucinations.  -We discussed the diagnosis as well as pathophysiology of the disease.  We discussed treatment options as well as prognostic indicators.  Patient education was provided.  -We discussed that it used to be thought that levodopa would increase risk of melanoma but now it is believed that Parkinsons itself likely increases risk of melanoma. he is to get regular skin  checks.  -increase rytary 245 mg to 2/2/1.  Did not tolerate IR well.  -Patient will continue his Comtan, 200 mg, 3 times a day with levodopa.    2.  Blepharospasm  -was improved today   3.  Adenocarcinoma of the colon  -Status post resection.  Now being managed by oncology.  4.  Orthostatic hypotension  -on midodrine and florinef.  Discussed concept of permissive hypertension.  Would not recommend treating high blood pressure unless systolic blood pressure consistently in the 190s-200s.  Patients daughter states that he was told the same by his cardiologist.  5.  Parkinson's dementia  -Daughter last visit wanted him on meds.  Discussed with him in detail today, but this daughter (and the patient) did not want him on more medication after learning about efficacy of meds.  6. Sialorrhea  -This is commonly associated with PD.  We talked about treatments.  The patient is not a candidate for oral anticholinergic therapy because of increased risk of confusion and falls.  We discussed Botox (type A and B) and 1% atropine drops.  We discusssed that candy like lemon drops can help by stimulating mm of the oropharynx to induce swallowing.  He really does not want Botox.   7.  Stage I pressure sore, left gluteal region  -We will send home health to the house.  He already has [facility] coming for physical therapy.  Will add nursing care.  They should follow-up with primary care physician as well.  8.  F/u 5 months.  Much greater than 50% of this visit was spent in counseling and coordinating care.  Total face to face time:  25 min   Cc:  Charletta Cousin, MD (Inactive)

## 2017-12-30 ENCOUNTER — Encounter: Payer: Self-pay | Admitting: Neurology

## 2017-12-30 ENCOUNTER — Ambulatory Visit: Payer: Medicare Other | Admitting: Neurology

## 2017-12-30 VITALS — BP 130/72 | HR 84 | Ht 74.0 in | Wt 186.0 lb

## 2017-12-30 DIAGNOSIS — L89321 Pressure ulcer of left buttock, stage 1: Secondary | ICD-10-CM | POA: Diagnosis not present

## 2017-12-30 DIAGNOSIS — F0280 Dementia in other diseases classified elsewhere without behavioral disturbance: Secondary | ICD-10-CM | POA: Diagnosis not present

## 2017-12-30 DIAGNOSIS — G20 Parkinson's disease: Secondary | ICD-10-CM

## 2017-12-30 MED ORDER — ENTACAPONE 200 MG PO TABS: 200.0000 mg | ORAL_TABLET | Freq: Three times a day (TID) | ORAL | 1 refills | Status: AC

## 2017-12-30 MED ORDER — CARBIDOPA-LEVODOPA ER 61.25-245 MG PO CPCR: 1.0000 | ORAL_CAPSULE | ORAL | 1 refills | Status: AC

## 2017-12-30 NOTE — Patient Instructions (Addendum)
1. Increase rytary 245mg  - 2 at 7am/2 at 11am/1 at 4pm 2.  Continue entacapone - 200 mg with each dose of rytary

## 2017-12-31 ENCOUNTER — Telehealth: Payer: Self-pay | Admitting: Neurology

## 2017-12-31 NOTE — Telephone Encounter (Signed)
Referral sent to [facility] to add home health nursing on to his already scheduled PT. Faxed to [phone] with confirmation received.

## 2018-01-17 DIAGNOSIS — R970 Elevated carcinoembryonic antigen [CEA]: Secondary | ICD-10-CM | POA: Diagnosis not present

## 2018-01-17 DIAGNOSIS — Z85030 Personal history of malignant carcinoid tumor of large intestine: Secondary | ICD-10-CM | POA: Diagnosis not present

## 2018-01-17 DIAGNOSIS — Z9049 Acquired absence of other specified parts of digestive tract: Secondary | ICD-10-CM | POA: Diagnosis not present

## 2018-01-23 ENCOUNTER — Telehealth: Payer: Self-pay | Admitting: Cardiology

## 2018-01-23 NOTE — Telephone Encounter (Signed)
Spoke with Mrs. Lowy informed her that I had reviewed the transmission and that Mr. Melroy pacemaker was functioning normally and that all the measurements on his leads were stable and that the only thing I seen was atrial fibrillation which is known, asked if she had taken pt's blood pressure she stated she had been and his BP had been running 585-277 systolic. Ms Bahena stated that she thought it could be the parkinsons as well but wanted to rule out anything heart related.

## 2018-01-23 NOTE — Telephone Encounter (Signed)
Patient called and stated that pt has fell at least 5 times this week and the last time he done this he had a lose wire in his device. Instructed pt daughter to send a remote transmission w/ pt home monitor. Once the transmission is received a Chief Operating Officer will review and call back with the results. Call the daughter at [phone].

## 2018-01-24 ENCOUNTER — Telehealth: Payer: Self-pay | Admitting: *Deleted

## 2018-01-24 NOTE — Telephone Encounter (Signed)
Is social work coming to the house?  If not, can we order social work c/s with Home health?

## 2018-01-24 NOTE — Telephone Encounter (Signed)
Chris (PTA from [facility]) called to let you know that patient has had several falls lately, his memory is worse and his cancer is back.  The night before last he ripped the toenail off of his big toe on left foot when he fell.  Gerald Stabs is going to have the homehealth nurse go out to assess patient for hospice.  Patient seems to be giving up and does not want to do anything else.

## 2018-01-29 ENCOUNTER — Other Ambulatory Visit: Payer: Self-pay | Admitting: *Deleted

## 2018-01-29 DIAGNOSIS — G20 Parkinson's disease: Secondary | ICD-10-CM

## 2018-01-29 NOTE — Telephone Encounter (Signed)
Referral faxed for social worker.

## 2018-02-07 NOTE — Telephone Encounter (Signed)
Dr. Tat patient 

## 2018-02-07 NOTE — Telephone Encounter (Signed)
Kenneth Macias from [facility] stating they never received a referral for the pt to have hospice care and would like to referral refaxed to [phone].

## 2018-02-10 ENCOUNTER — Emergency Department (HOSPITAL_COMMUNITY): Payer: Medicare Other

## 2018-02-10 ENCOUNTER — Encounter (HOSPITAL_COMMUNITY)
Admission: EM | Disposition: A | Discharge status: Hospice | Payer: Self-pay | Source: Home / Self Care | Attending: Internal Medicine

## 2018-02-10 ENCOUNTER — Inpatient Hospital Stay (HOSPITAL_COMMUNITY)
Admission: EM | Admit: 2018-02-10 | Discharge: 2018-02-11 | DRG: 281 | Disposition: A | Discharge status: Hospice | Payer: Medicare Other | Attending: Internal Medicine | Admitting: Internal Medicine

## 2018-02-10 ENCOUNTER — Encounter (HOSPITAL_COMMUNITY): Payer: Self-pay

## 2018-02-10 ENCOUNTER — Ambulatory Visit (HOSPITAL_COMMUNITY): Payer: Medicare Other

## 2018-02-10 ENCOUNTER — Other Ambulatory Visit: Payer: Self-pay

## 2018-02-10 DIAGNOSIS — I25118 Atherosclerotic heart disease of native coronary artery with other forms of angina pectoris: Secondary | ICD-10-CM

## 2018-02-10 DIAGNOSIS — E785 Hyperlipidemia, unspecified: Secondary | ICD-10-CM | POA: Diagnosis present

## 2018-02-10 DIAGNOSIS — N179 Acute kidney failure, unspecified: Secondary | ICD-10-CM | POA: Diagnosis present

## 2018-02-10 DIAGNOSIS — R7989 Other specified abnormal findings of blood chemistry: Secondary | ICD-10-CM

## 2018-02-10 DIAGNOSIS — Z955 Presence of coronary angioplasty implant and graft: Secondary | ICD-10-CM

## 2018-02-10 DIAGNOSIS — N183 Chronic kidney disease, stage 3 (moderate): Secondary | ICD-10-CM | POA: Diagnosis present

## 2018-02-10 DIAGNOSIS — R296 Repeated falls: Secondary | ICD-10-CM | POA: Diagnosis present

## 2018-02-10 DIAGNOSIS — Z66 Do not resuscitate: Secondary | ICD-10-CM | POA: Diagnosis present

## 2018-02-10 DIAGNOSIS — I609 Nontraumatic subarachnoid hemorrhage, unspecified: Secondary | ICD-10-CM | POA: Diagnosis not present

## 2018-02-10 DIAGNOSIS — I480 Paroxysmal atrial fibrillation: Secondary | ICD-10-CM | POA: Diagnosis not present

## 2018-02-10 DIAGNOSIS — E119 Type 2 diabetes mellitus without complications: Secondary | ICD-10-CM | POA: Diagnosis not present

## 2018-02-10 DIAGNOSIS — F0280 Dementia in other diseases classified elsewhere without behavioral disturbance: Secondary | ICD-10-CM | POA: Diagnosis present

## 2018-02-10 DIAGNOSIS — I219 Acute myocardial infarction, unspecified: Secondary | ICD-10-CM | POA: Diagnosis not present

## 2018-02-10 DIAGNOSIS — I10 Essential (primary) hypertension: Secondary | ICD-10-CM | POA: Diagnosis present

## 2018-02-10 DIAGNOSIS — I25110 Atherosclerotic heart disease of native coronary artery with unstable angina pectoris: Secondary | ICD-10-CM | POA: Diagnosis present

## 2018-02-10 DIAGNOSIS — I200 Unstable angina: Secondary | ICD-10-CM | POA: Diagnosis present

## 2018-02-10 DIAGNOSIS — J90 Pleural effusion, not elsewhere classified: Secondary | ICD-10-CM | POA: Diagnosis present

## 2018-02-10 DIAGNOSIS — I2510 Atherosclerotic heart disease of native coronary artery without angina pectoris: Secondary | ICD-10-CM | POA: Diagnosis present

## 2018-02-10 DIAGNOSIS — R0902 Hypoxemia: Secondary | ICD-10-CM | POA: Diagnosis present

## 2018-02-10 DIAGNOSIS — F0281 Dementia in other diseases classified elsewhere with behavioral disturbance: Secondary | ICD-10-CM

## 2018-02-10 DIAGNOSIS — I214 Non-ST elevation (NSTEMI) myocardial infarction: Secondary | ICD-10-CM | POA: Diagnosis not present

## 2018-02-10 DIAGNOSIS — I252 Old myocardial infarction: Secondary | ICD-10-CM

## 2018-02-10 DIAGNOSIS — C189 Malignant neoplasm of colon, unspecified: Secondary | ICD-10-CM | POA: Diagnosis present

## 2018-02-10 DIAGNOSIS — G309 Alzheimer's disease, unspecified: Secondary | ICD-10-CM | POA: Diagnosis present

## 2018-02-10 DIAGNOSIS — E1122 Type 2 diabetes mellitus with diabetic chronic kidney disease: Secondary | ICD-10-CM | POA: Diagnosis present

## 2018-02-10 DIAGNOSIS — R079 Chest pain, unspecified: Secondary | ICD-10-CM | POA: Diagnosis present

## 2018-02-10 DIAGNOSIS — Z800 Family history of malignant neoplasm of digestive organs: Secondary | ICD-10-CM

## 2018-02-10 DIAGNOSIS — I495 Sick sinus syndrome: Secondary | ICD-10-CM | POA: Diagnosis not present

## 2018-02-10 DIAGNOSIS — Z833 Family history of diabetes mellitus: Secondary | ICD-10-CM

## 2018-02-10 DIAGNOSIS — I129 Hypertensive chronic kidney disease with stage 1 through stage 4 chronic kidney disease, or unspecified chronic kidney disease: Secondary | ICD-10-CM | POA: Diagnosis present

## 2018-02-10 DIAGNOSIS — I4892 Unspecified atrial flutter: Secondary | ICD-10-CM | POA: Diagnosis present

## 2018-02-10 DIAGNOSIS — I213 ST elevation (STEMI) myocardial infarction of unspecified site: Principal | ICD-10-CM | POA: Diagnosis present

## 2018-02-10 DIAGNOSIS — Z9049 Acquired absence of other specified parts of digestive tract: Secondary | ICD-10-CM

## 2018-02-10 DIAGNOSIS — R778 Other specified abnormalities of plasma proteins: Secondary | ICD-10-CM

## 2018-02-10 DIAGNOSIS — F17220 Nicotine dependence, chewing tobacco, uncomplicated: Secondary | ICD-10-CM | POA: Diagnosis present

## 2018-02-10 DIAGNOSIS — Z85038 Personal history of other malignant neoplasm of large intestine: Secondary | ICD-10-CM

## 2018-02-10 DIAGNOSIS — Z515 Encounter for palliative care: Secondary | ICD-10-CM | POA: Diagnosis present

## 2018-02-10 DIAGNOSIS — N189 Chronic kidney disease, unspecified: Secondary | ICD-10-CM | POA: Diagnosis present

## 2018-02-10 DIAGNOSIS — Z79899 Other long term (current) drug therapy: Secondary | ICD-10-CM

## 2018-02-10 DIAGNOSIS — G20 Parkinson's disease: Secondary | ICD-10-CM | POA: Diagnosis present

## 2018-02-10 DIAGNOSIS — Z7952 Long term (current) use of systemic steroids: Secondary | ICD-10-CM

## 2018-02-10 LAB — COMPREHENSIVE METABOLIC PANEL
ALT: 7 U/L (ref 0–44)
AST: 28 U/L (ref 15–41)
Albumin: 3.5 g/dL (ref 3.5–5.0)
Alkaline Phosphatase: 52 U/L (ref 38–126)
Anion gap: 7 (ref 5–15)
BUN: 16 mg/dL (ref 8–23)
CHLORIDE: 101 mmol/L (ref 98–111)
CO2: 30 mmol/L (ref 22–32)
Calcium: 9.1 mg/dL (ref 8.9–10.3)
Creatinine, Ser: 1.2 mg/dL (ref 0.61–1.24)
GFR, EST NON AFRICAN AMERICAN: 55 mL/min — AB (ref >60)
Glucose, Bld: 120 mg/dL — ABNORMAL HIGH (ref 70–99)
POTASSIUM: 3.9 mmol/L (ref 3.5–5.1)
Sodium: 138 mmol/L (ref 135–145)
Total Bilirubin: 0.8 mg/dL (ref 0.3–1.2)
Total Protein: 5.9 g/dL — ABNORMAL LOW (ref 6.5–8.1)

## 2018-02-10 LAB — CBC WITH DIFFERENTIAL/PLATELET
Abs Immature Granulocytes: 0.1 10*3/uL (ref 0.0–0.1)
Basophils Absolute: 0 10*3/uL (ref 0.0–0.1)
Basophils Relative: 0 %
Eosinophils Absolute: 0.1 10*3/uL (ref 0.0–0.7)
Eosinophils Relative: 1 %
HCT: 38.3 % — ABNORMAL LOW (ref 39.0–52.0)
HEMOGLOBIN: 12.4 g/dL — AB (ref 13.0–17.0)
IMMATURE GRANULOCYTES: 1 %
LYMPHS ABS: 2.2 10*3/uL (ref 0.7–4.0)
LYMPHS PCT: 26 %
MCH: 31.1 pg (ref 26.0–34.0)
MCHC: 32.4 g/dL (ref 30.0–36.0)
MCV: 96 fL (ref 78.0–100.0)
Monocytes Absolute: 1.1 10*3/uL — ABNORMAL HIGH (ref 0.1–1.0)
Monocytes Relative: 12 %
NEUTROS ABS: 5.2 10*3/uL (ref 1.7–7.7)
NEUTROS PCT: 60 %
Platelets: 166 10*3/uL (ref 150–400)
RBC: 3.99 MIL/uL — AB (ref 4.22–5.81)
RDW: 12.4 % (ref 11.5–15.5)
WBC: 8.7 10*3/uL (ref 4.0–10.5)

## 2018-02-10 LAB — TROPONIN I
Troponin I: 36.09 ng/mL (ref <0.03)
Troponin I: 60.81 ng/mL (ref <0.03)

## 2018-02-10 LAB — I-STAT TROPONIN, ED: Troponin i, poc: 1.29 ng/mL (ref 0.00–0.08)

## 2018-02-10 LAB — HEPARIN LEVEL (UNFRACTIONATED): Heparin Unfractionated: 0.58 IU/mL (ref 0.30–0.70)

## 2018-02-10 SURGERY — INVASIVE LAB ABORTED CASE

## 2018-02-10 MED ORDER — LORAZEPAM 0.5 MG PO TABS
0.5000 mg | ORAL_TABLET | Freq: Four times a day (QID) | ORAL | Status: DC | PRN
  Administered 2018-02-10: 0.5 mg via ORAL
  Filled 2018-02-10: qty 1

## 2018-02-10 MED ORDER — CARBIDOPA-LEVODOPA ER 50-200 MG PO TBCR
1.0000 | EXTENDED_RELEASE_TABLET | Freq: Every day | ORAL | Status: DC
  Administered 2018-02-10: 1 via ORAL
  Filled 2018-02-10: qty 1

## 2018-02-10 MED ORDER — PANTOPRAZOLE SODIUM 40 MG PO TBEC: 40.0000 mg | DELAYED_RELEASE_TABLET | Freq: Every day | ORAL | Status: DC

## 2018-02-10 MED ORDER — CARBIDOPA-LEVODOPA ER 50-200 MG PO TBCR
2.0000 | EXTENDED_RELEASE_TABLET | Freq: Two times a day (BID) | ORAL | Status: DC
  Filled 2018-02-10 (×2): qty 2

## 2018-02-10 MED ORDER — HEPARIN BOLUS VIA INFUSION: 4000.0000 [IU] | Freq: Once | INTRAVENOUS | Status: DC

## 2018-02-10 MED ORDER — ACETAMINOPHEN 325 MG PO TABS: 650.0000 mg | ORAL_TABLET | Freq: Four times a day (QID) | ORAL | Status: DC | PRN

## 2018-02-10 MED ORDER — SODIUM CHLORIDE 0.9 % IV SOLN
INTRAVENOUS | Status: DC
  Administered 2018-02-10: 14:00:00 via INTRAVENOUS

## 2018-02-10 MED ORDER — VITAMIN D 1000 UNITS PO TABS: 1000.0000 [IU] | ORAL_TABLET | Freq: Every day | ORAL | Status: DC

## 2018-02-10 MED ORDER — ENTACAPONE 200 MG PO TABS
200.0000 mg | ORAL_TABLET | Freq: Three times a day (TID) | ORAL | Status: DC
  Administered 2018-02-10: 200 mg via ORAL
  Filled 2018-02-10 (×3): qty 1

## 2018-02-10 MED ORDER — FLUDROCORTISONE ACETATE 0.1 MG PO TABS
0.1000 mg | ORAL_TABLET | Freq: Every day | ORAL | Status: DC
  Filled 2018-02-10: qty 1

## 2018-02-10 MED ORDER — MORPHINE SULFATE (PF) 4 MG/ML IV SOLN
2.0000 mg | Freq: Once | INTRAVENOUS | Status: AC
  Administered 2018-02-10: 2 mg via INTRAVENOUS
  Filled 2018-02-10: qty 1

## 2018-02-10 MED ORDER — SODIUM CHLORIDE 0.9 % IV SOLN
INTRAVENOUS | Status: AC
  Administered 2018-02-11: 04:00:00 via INTRAVENOUS

## 2018-02-10 MED ORDER — FLUOXETINE HCL 20 MG PO TABS
10.0000 mg | ORAL_TABLET | Freq: Every day | ORAL | Status: DC
  Administered 2018-02-10: 10 mg via ORAL
  Filled 2018-02-10 (×3): qty 1

## 2018-02-10 MED ORDER — RANOLAZINE ER 500 MG PO TB12
1000.0000 mg | ORAL_TABLET | Freq: Two times a day (BID) | ORAL | Status: DC
  Administered 2018-02-10: 1000 mg via ORAL
  Filled 2018-02-10: qty 2

## 2018-02-10 MED ORDER — METOPROLOL TARTRATE 12.5 MG HALF TABLET
12.5000 mg | ORAL_TABLET | Freq: Two times a day (BID) | ORAL | Status: DC
  Administered 2018-02-10 (×2): 12.5 mg via ORAL
  Filled 2018-02-10 (×2): qty 1

## 2018-02-10 MED ORDER — NITROGLYCERIN 2 % TD OINT
0.5000 [in_us] | TOPICAL_OINTMENT | Freq: Once | TRANSDERMAL | Status: AC
  Administered 2018-02-10: 0.5 [in_us] via TOPICAL
  Filled 2018-02-10: qty 1

## 2018-02-10 MED ORDER — ACETAMINOPHEN 650 MG RE SUPP: 650.0000 mg | Freq: Four times a day (QID) | RECTAL | Status: DC | PRN

## 2018-02-10 MED ORDER — FERROUS SULFATE 325 (65 FE) MG PO TABS: 650.0000 mg | ORAL_TABLET | Freq: Every day | ORAL | Status: DC

## 2018-02-10 MED ORDER — LORAZEPAM 2 MG/ML IJ SOLN
0.5000 mg | Freq: Once | INTRAMUSCULAR | Status: DC
  Filled 2018-02-10: qty 1

## 2018-02-10 MED ORDER — HEPARIN (PORCINE) IN NACL 100-0.45 UNIT/ML-% IJ SOLN
1000.0000 [IU]/h | INTRAMUSCULAR | Status: DC
  Administered 2018-02-10 – 2018-02-11 (×3): 1000 [IU]/h via INTRAVENOUS
  Filled 2018-02-10 (×2): qty 250

## 2018-02-10 MED ORDER — CARBIDOPA-LEVODOPA ER 61.25-245 MG PO CPCR: 1.0000 | ORAL_CAPSULE | ORAL | Status: DC

## 2018-02-10 MED ORDER — MORPHINE SULFATE (PF) 2 MG/ML IV SOLN
1.0000 mg | INTRAVENOUS | Status: DC | PRN
  Administered 2018-02-10 – 2018-02-11 (×2): 1 mg via INTRAVENOUS
  Filled 2018-02-10 (×2): qty 1

## 2018-02-10 MED ORDER — HEPARIN BOLUS VIA INFUSION
4000.0000 [IU] | Freq: Once | INTRAVENOUS | Status: AC
  Administered 2018-02-10: 4000 [IU] via INTRAVENOUS
  Filled 2018-02-10: qty 4000

## 2018-02-10 SURGICAL SUPPLY — 4 items
KIT HEART LEFT (KITS) IMPLANT
PACK CARDIAC CATHETERIZATION (CUSTOM PROCEDURE TRAY) IMPLANT
TRANSDUCER W/STOPCOCK (MISCELLANEOUS) IMPLANT
TUBING CIL FLEX 10 FLL-RA (TUBING) IMPLANT

## 2018-02-10 NOTE — ED Notes (Signed)
Paged cardiology.  They stated they are not admitting pt.  Pt to be admitted by [facility].  EDP to be notified.

## 2018-02-10 NOTE — Progress Notes (Signed)
ANTICOAGULATION CONSULT NOTE - Initial Consult  Pharmacy Consult for heparin Indication: chest pain/ACS  Allergies  Allergen Reactions  . Ketoconazole Rash    Patient Measurements: Height: 6' (182.9 cm) Weight: 185 lb (83.9 kg) IBW/kg (Calculated) : 77.6 Heparin Dosing Weight: 83.9kg  Vital Signs: Temp: 97.8 F (36.6 C) (07/15 0432) Temp Source: Oral (07/15 0432) BP: 138/94 (07/15 1115) Pulse Rate: 93 (07/15 1115)  Labs: Recent Labs    02/10/18 0532  HGB 12.4*  HCT 38.3*  PLT 166  CREATININE 1.20    Estimated Creatinine Clearance: 53 mL/min (by C-G formula based on SCr of 1.2 mg/dL).   Medical History: Past Medical History:  Diagnosis Date  . Adenocarcinoma of colon ([place]) 01/03/2017  . Alzheimer's disease 01/06/2016  . Atrial flutter ([place]) 12/18/2016  . Chest pain 05/29/2015  . Chronic kidney disease 05/31/2015  . Coronary artery disease involving native coronary artery    Overview:  Overview:  1. PCI and BMS to RCA 2011 2. PCI and DES to RCA 07/30/11  Cardiac cath 05/31/15: Cath 05/31/15 Conclusion  1. Mid RCA to Dist RCA lesion, 20% stenosed. The lesion was previously treated with a stent (unknown type). 2. Prox RCA lesion, 20% stenosed. 3. Mid RCA lesion, 20% stenosed. 4. 1st RPLB lesion, 99% stenosed. 5. Ost RPDA lesion, 60% stenosed. 6. LM lesion, 40% stenosed. 7. Ost Cx to Prox Cx lesion, 40% stenosed. 8. 2nd Mrg lesion, 60% stenosed. 9. Ost LAD to Mid LAD lesion, 30% stenosed. 10. Mid LAD to Dist LAD lesion, 60% stenosed. 11. Ost 2nd Diag to 2nd Diag lesion, 99% stenosed. 12. Ost 1st Diag to 1st Diag lesion, 99% stenosed.  1. Unstable angina 2. The right coronary artery is diffusely calcified. There is mild disease in the proximal and mid segments. The distal stent is patent. Beyond the stent the posterolateral branches are small in caliber and diffusely diseased. These vessels are too small for PCI.  3. The Circumflex terminates into an obtuse margi  . DM (diabetes  mellitus) ([place]) 05/31/2015  . Fall 01/06/2016  . Hyperlipidemia 05/31/2015  . Hypertension   . Hypertensive kidney disease with stage 3 chronic kidney disease ([place]) 04/25/2015  . Leukocytosis 05/11/2016  . Myocardial infarction ([place]) 07/30/2014   Overview:  x2  . NSTEMI (non-ST elevated myocardial infarction) ([place]) 05/31/2015  . Orthostatic hypotension 05/11/2016  . Parkinson disease ([place]) 05/31/2015  . Paroxysmal atrial flutter ([place]) 06/11/2016  . Subarachnoid hemorrhage ([place]) 01/06/2016  . Syncope 01/09/2016  . Type 2 diabetes mellitus without complication, without long-term current use of insulin ([place]) 06/10/2016  . Unstable angina pectoris ([place]) 05/31/2015    Assessment: 68 yom presented to the ED with CP. Troponin elevated and now starting IV heparin. Baseline Hgb and platelets are WNL. He is not on anticoagulation PTA.   Goal of Therapy:  Heparin level 0.3-0.7 units/ml Monitor platelets by anticoagulation protocol: Yes   Plan:  Heparin bolus 4000 units IV x 1  Heparin gtt 1000 units/hr Check an 8 hr heparin level Daily heparin level and CBC  Briellah Baik, Rande Lawman 02/10/2018,11:23 AM

## 2018-02-10 NOTE — ED Notes (Signed)
Hospice RN at bedside

## 2018-02-10 NOTE — ED Notes (Signed)
ED Provider at bedside. 

## 2018-02-10 NOTE — Progress Notes (Signed)
ANTICOAGULATION CONSULT NOTE - Follow Up Consult  Pharmacy Consult for heparin Indication: chest pain/ACS  Allergies  Allergen Reactions  . Ketoconazole Rash    Patient Measurements: Height: 6' (182.9 cm) Weight: 185 lb (83.9 kg) IBW/kg (Calculated) : 77.6 Heparin Dosing Weight: 83.9kg  Vital Signs: BP: 121/87 (07/15 1700) Pulse Rate: 101 (07/15 1700)  Labs: Recent Labs    02/10/18 0532 02/10/18 1213 02/10/18 1626  HGB 12.4*  --   --   HCT 38.3*  --   --   PLT 166  --   --   HEPARINUNFRC  --   --  0.58  CREATININE 1.20  --   --   TROPONINI  --  36.09*  --     Estimated Creatinine Clearance: 53 mL/min (by C-G formula based on SCr of 1.2 mg/dL).   Medical History: Past Medical History:  Diagnosis Date  . Adenocarcinoma of colon ([place]) 01/03/2017  . Alzheimer's disease 01/06/2016  . Atrial flutter ([place]) 12/18/2016  . Chest pain 05/29/2015  . Chronic kidney disease 05/31/2015  . Coronary artery disease involving native coronary artery    Overview:  Overview:  1. PCI and BMS to RCA 2011 2. PCI and DES to RCA 07/30/11  Cardiac cath 05/31/15: Cath 05/31/15 Conclusion  1. Mid RCA to Dist RCA lesion, 20% stenosed. The lesion was previously treated with a stent (unknown type). 2. Prox RCA lesion, 20% stenosed. 3. Mid RCA lesion, 20% stenosed. 4. 1st RPLB lesion, 99% stenosed. 5. Ost RPDA lesion, 60% stenosed. 6. LM lesion, 40% stenosed. 7. Ost Cx to Prox Cx lesion, 40% stenosed. 8. 2nd Mrg lesion, 60% stenosed. 9. Ost LAD to Mid LAD lesion, 30% stenosed. 10. Mid LAD to Dist LAD lesion, 60% stenosed. 11. Ost 2nd Diag to 2nd Diag lesion, 99% stenosed. 12. Ost 1st Diag to 1st Diag lesion, 99% stenosed.  1. Unstable angina 2. The right coronary artery is diffusely calcified. There is mild disease in the proximal and mid segments. The distal stent is patent. Beyond the stent the posterolateral branches are small in caliber and diffusely diseased. These vessels are too small for PCI.  3. The  Circumflex terminates into an obtuse margi  . DM (diabetes mellitus) ([place]) 05/31/2015  . Fall 01/06/2016  . Hyperlipidemia 05/31/2015  . Hypertension   . Hypertensive kidney disease with stage 3 chronic kidney disease ([place]) 04/25/2015  . Leukocytosis 05/11/2016  . Myocardial infarction ([place]) 07/30/2014   Overview:  x2  . NSTEMI (non-ST elevated myocardial infarction) ([place]) 05/31/2015  . Orthostatic hypotension 05/11/2016  . Parkinson disease ([place]) 05/31/2015  . Paroxysmal atrial flutter ([place]) 06/11/2016  . Subarachnoid hemorrhage ([place]) 01/06/2016  . Syncope 01/09/2016  . Type 2 diabetes mellitus without complication, without long-term current use of insulin ([place]) 06/10/2016  . Unstable angina pectoris ([place]) 05/31/2015    Assessment: 11 yom presented to the ED with CP. Troponin elevated and now starting IV heparin. Baseline Hgb and platelets are WNL. He is not on anticoagulation PTA.   Currently on IV heparin at 1000 units/hr. Initial HL is therapeutic.   Goal of Therapy:  Heparin level 0.3-0.7 units/ml Monitor platelets by anticoagulation protocol: Yes   Plan:  Continue heparin gtt at 1000 units/hr Check an 8 hr confirmatory heparin level Daily heparin level and CBC  Albertina Parr, PharmD., BCPS Clinical Pharmacist Clinical phone for 02/10/18 until 3:30pm: [phone] If after 3:30pm, please refer to [facility] for unit-specific pharmacist

## 2018-02-10 NOTE — Telephone Encounter (Signed)
Okay to send referral for hospice or should this go to oncology?

## 2018-02-10 NOTE — ED Notes (Addendum)
Internal Medicine and Cardiology paged and responded RE pt's troponin.

## 2018-02-10 NOTE — ED Notes (Signed)
Family becoming upset that cardiology has not been to see pt.  No labs or pain meds ordered.  Cardiology re-paged.

## 2018-02-10 NOTE — ED Notes (Signed)
Cardiology paged.  Pt c/o continued chest pain.  Cards stated will be down shortly.

## 2018-02-10 NOTE — ED Notes (Signed)
MD paged re increased agitation, per family.

## 2018-02-10 NOTE — ED Provider Notes (Signed)
[facility] EMERGENCY DEPARTMENT Provider Note   CSN: [id number] Arrival date & time: 02/10/18  7673     History   Chief Complaint Chief Complaint  Patient presents with  . Chest Pain    HPI Kenneth Macias is a 84 y.o. male.  Patient is an 84 year old male with extensive past medical history including coronary artery disease with multiple stents, diabetes, Parkinson's, and pacemaker.  He presents today complaining of chest pain.  This woke him from sleep approximately an hour prior to presentation.  He describes a heavy pressure to the center of his chest with associated shortness of breath but no nausea, diaphoresis, or radiation.  He told his daughter he "felt like he was having a heart attack".  He tells me that this pain was similar to his prior cardiac pain and did improve with sublingual nitroglycerin given by EMS.  The history is provided by the patient.  Chest Pain   This is a new problem. The current episode started 1 to 2 hours ago. The problem occurs constantly. The problem has been rapidly improving. The pain is present in the substernal region. The pain is moderate. The quality of the pain is described as pressure-like. The pain does not radiate. Pertinent negatives include no fever and no shortness of breath. He has tried nothing for the symptoms. The treatment provided no relief.  His past medical history is significant for CAD.    Past Medical History:  Diagnosis Date  . Adenocarcinoma of colon ([place]) 01/03/2017  . Alzheimer's disease 01/06/2016  . Atrial flutter ([place]) 12/18/2016  . Chest pain 05/29/2015  . Chronic kidney disease 05/31/2015  . Coronary artery disease involving native coronary artery    Overview:  Overview:  1. PCI and BMS to RCA 2011 2. PCI and DES to RCA 07/30/11  Cardiac cath 05/31/15: Cath 05/31/15 Conclusion  1. Mid RCA to Dist RCA lesion, 20% stenosed. The lesion was previously treated with a stent (unknown type). 2. Prox RCA lesion, 20%  stenosed. 3. Mid RCA lesion, 20% stenosed. 4. 1st RPLB lesion, 99% stenosed. 5. Ost RPDA lesion, 60% stenosed. 6. LM lesion, 40% stenosed. 7. Ost Cx to Prox Cx lesion, 40% stenosed. 8. 2nd Mrg lesion, 60% stenosed. 9. Ost LAD to Mid LAD lesion, 30% stenosed. 10. Mid LAD to Dist LAD lesion, 60% stenosed. 11. Ost 2nd Diag to 2nd Diag lesion, 99% stenosed. 12. Ost 1st Diag to 1st Diag lesion, 99% stenosed.  1. Unstable angina 2. The right coronary artery is diffusely calcified. There is mild disease in the proximal and mid segments. The distal stent is patent. Beyond the stent the posterolateral branches are small in caliber and diffusely diseased. These vessels are too small for PCI.  3. The Circumflex terminates into an obtuse margi  . DM (diabetes mellitus) ([place]) 05/31/2015  . Fall 01/06/2016  . Hyperlipidemia 05/31/2015  . Hypertension   . Hypertensive kidney disease with stage 3 chronic kidney disease ([place]) 04/25/2015  . Leukocytosis 05/11/2016  . Myocardial infarction ([place]) 07/30/2014   Overview:  x2  . NSTEMI (non-ST elevated myocardial infarction) ([place]) 05/31/2015  . Orthostatic hypotension 05/11/2016  . Parkinson disease ([place]) 05/31/2015  . Paroxysmal atrial flutter ([place]) 06/11/2016  . Subarachnoid hemorrhage ([place]) 01/06/2016  . Syncope 01/09/2016  . Type 2 diabetes mellitus without complication, without long-term current use of insulin ([place]) 06/10/2016  . Unstable angina pectoris ([place]) 05/31/2015    Patient Active Problem List   Diagnosis Date Noted  .  Sick sinus syndrome ([place]) 07/11/2017  . Hallucination, visual 06/25/2017  . Pacemaker lead malfunction 05/28/2017  . Pacemaker 04/30/2017  . Adenocarcinoma of colon ([place]) 01/03/2017  . Atrial flutter ([place]) 12/18/2016  . Preoperative cardiovascular examination 07/10/2016  . Normal left ventricular systolic function and wall motion 06/25/2016  . Paroxysmal atrial flutter ([place]) 06/11/2016  . Neutrophilic leukocytosis 67/59/1638  . Type 2 diabetes  mellitus without complication, without long-term current use of insulin ([place]) 06/10/2016  . Leukocytosis 05/11/2016  . Orthostatic hypotension 05/11/2016  . Syncope 01/09/2016  . Alzheimer's disease 01/06/2016  . Fall 01/06/2016  . Laceration of scalp 01/06/2016  . Subarachnoid hemorrhage ([place]) 01/06/2016  . Unstable angina pectoris ([place]) 05/31/2015  . Coronary artery disease 05/31/2015  . Hyperlipidemia 05/31/2015  . DM (diabetes mellitus) ([place]) 05/31/2015  . Parkinson disease ([place]) 05/31/2015  . Chronic kidney disease 05/31/2015  . Essential hypertension 05/31/2015  . NSTEMI (non-ST elevated myocardial infarction) ([place]) 05/31/2015  . Coronary artery disease involving native coronary artery   . Chest pain 05/29/2015  . Hypertensive kidney disease with stage 3 chronic kidney disease ([place]) 04/25/2015  . Myocardial infarction ([place]) 07/30/2014    Past Surgical History:  Procedure Laterality Date  . CARDIAC CATHETERIZATION N/A 05/31/2015   Procedure: Left Heart Cath and Coronary Angiography;  Surgeon: Burnell Blanks, MD;  Location: [facility];  Service: Cardiovascular;  Laterality: N/A;  . COLON RESECTION    . COLONOSCOPY    . CORONARY ANGIOPLASTY WITH STENT PLACEMENT    . HAND SURGERY    . PACEMAKER REVISION N/A 07/11/2017   Procedure: PACEMAKER REVISION;  Surgeon: Constance Haw, MD;  Location: [facility];  Service: Cardiovascular;  Laterality: N/A;        Home Medications    Prior to Admission medications   Medication Sig Start Date End Date Taking? Authorizing Provider  AMBULATORY NON FORMULARY MEDICATION Manual Wheelchair 16x18 full length removable arm rest Removable elevated leg rest 2 inch cusion  DX: G20 12/26/17   Tat, Eustace Quail, DO  Carbidopa-Levodopa ER (RYTARY) 61.25-245 MG CPCR Take 1 tablet by mouth See admin instructions. TAKES 2 TABLETS IN THE AM, 2 TABLET AT NOON, 1 TABLET IN THE EVENING 12/30/17   Tat, Eustace Quail, DO    cholecalciferol (VITAMIN D) 1000 UNITS tablet Take 1,000 Units by mouth daily.    [provider]  diphenhydramine-acetaminophen (TYLENOL PM) 25-500 MG TABS tablet Take 1 tablet by mouth at bedtime as needed (for sleep).    [provider]  entacapone (COMTAN) 200 MG tablet Take 1 tablet (200 mg total) by mouth 3 (three) times daily. 12/30/17   Tat, Eustace Quail, DO  Ferrous Sulfate (IRON) 325 (65 Fe) MG TABS Take 2 tablets by mouth daily.    [provider]  fludrocortisone (FLORINEF) 0.1 MG tablet Take 1 tablet (0.1 mg total) by mouth daily. 09/05/17   Richardo Priest, MD  FLUoxetine (PROZAC) 10 MG tablet Take 10 mg by mouth at bedtime.     [provider]  isosorbide mononitrate (IMDUR) 60 MG 24 hr tablet Take 30 mg by mouth daily.    [provider]  pantoprazole (PROTONIX) 40 MG tablet Take 40 mg by mouth daily.    [provider]  ranolazine (RANEXA) 1000 MG SR tablet Take 1 tablet (1,000 mg total) by mouth 2 (two) times daily. 06/24/17 06/24/18  Richardo Priest, MD    Family History Family History  Problem Relation Age of  Onset  . Cancer Mother   . Colon cancer Mother   . Heart attack Father   . Heart disease Father   . Diabetes Father   . CAD Son   . Colon cancer Son   . CAD Daughter   . Heart disease Daughter     Social History Social History   Tobacco Use  . Smoking status: Never Smoker  . Smokeless tobacco: Current User    Types: Chew  Substance Use Topics  . Alcohol use: No  . Drug use: No     Allergies   Patient has no known allergies.   Review of Systems Review of Systems  Constitutional: Negative for fever.  Respiratory: Negative for shortness of breath.   Cardiovascular: Positive for chest pain.  All other systems reviewed and are negative.    Physical Exam Updated Vital Signs BP 131/90 (BP Location: Left Arm)   Pulse (!) 117   Temp 97.8 F (36.6 C) (Oral)   Resp 20   Ht 6' (1.829 m)   Wt 83.9  kg (185 lb)   SpO2 100%   BMI 25.09 kg/m   Physical Exam  Constitutional: He is oriented to person, place, and time. He appears well-developed and well-nourished. No distress.  HENT:  Head: Normocephalic and atraumatic.  Mouth/Throat: Oropharynx is clear and moist.  Neck: Normal range of motion. Neck supple.  Cardiovascular: Normal rate and regular rhythm. Exam reveals no friction rub.  No murmur heard. Pulmonary/Chest: Effort normal and breath sounds normal. No respiratory distress. He has no wheezes. He has no rales.  Abdominal: Soft. Bowel sounds are normal. He exhibits no distension. There is no tenderness.  Musculoskeletal: Normal range of motion. He exhibits no edema.       Right lower leg: Normal. He exhibits no tenderness and no edema.       Left lower leg: Normal. He exhibits no tenderness and no edema.  Neurological: He is alert and oriented to person, place, and time. Coordination normal.  Skin: Skin is warm and dry. He is not diaphoretic.  Nursing note and vitals reviewed.    ED Treatments / Results  Labs (all labs ordered are listed, but only abnormal results are displayed) Labs Reviewed - No data to display  EKG EKG Interpretation  Date/Time:  Monday February 10 2018 04:27:53 EDT Ventricular Rate:  119 PR Interval:    QRS Duration: 192 QT Interval:  388 QTC Calculation: 546 R Axis:   118 Text Interpretation:  Ventricular-paced rhythm Confirmed by Veryl Speak [phone]) on 02/10/2018 4:32:45 AM   Radiology No results found.  Procedures Procedures (including critical care time)  Medications Ordered in ED Medications  morphine 4 MG/ML injection 2 mg (has no administration in time range)     Initial Impression / Assessment and Plan / ED Course  I have reviewed the triage vital signs and the nursing notes.  Pertinent labs & imaging results that were available during my care of the patient were reviewed by me and considered in my medical decision making (see  chart for details).  Patient presents with chest pain.  He has a history of coronary artery disease and this felt like his cardiac pain.  His EKG is unchanged, but does have a troponin of 1.29.  This was discussed with cardiology who will evaluate the patient.  He received aspirin by EMS and will be given heparin in the ED.  Final Clinical Impressions(s) / ED Diagnoses   Final diagnoses:  None  ED Discharge Orders    None       Veryl Speak, MD 02/10/18 [phone]

## 2018-02-10 NOTE — Progress Notes (Addendum)
INVASIVE/INTERVENTIONAL CONSULT    84 year old gentleman awakened at 3 AM with chest pain and through the day has had continuing chest discomfort and troponin I trending from 1.29 at 5:42 AM to 36.09 at 12:13 PM.  Not currently having chest pain in the Cath Lab (6 PM).  Coronary angiography was recommended and possible PCI if easy culprit.  Comorbidities include history of subarachnoid hemorrhage, Parkinson's disease, dementia with frequent aggressive sundowning, recurrent/metastatic colon cancer, PAF, CKD stage II-III, and sick sinus syndrome.  Was on hospice and DNR until he came to the hospital today.  DNR has been rescinded to allow heart catheterization and possible PCI.  The patient was examined. Denies chest discomfort.  He is somewhat dyspneic.  Respiratory rate is 16. Rales at the bases.  Cardiac exam reveals soft gallop and apical systolic murmur.  No peripheral edema.  Neuro exam reveals no focal deficit, mild disorientation (not aware of the date or day.  Does know he is in the hospital).  Review of most recent cardiac catheterization November 2016 demonstrates severe three-vessel heavy coronary calcification and severe distal diffuse disease in all territories.  DISCUSSION WITH FAMILY: The family (3 daughters and a son) does not desire that the patient be placed on life support.  The patient was DNR prior to coming in today.  DNR was rescinded to allow heart cath.  Long discussion with the family allowed them to understand he is having / has had a heart attack this admission.  Given his age and known diffuse coronary disease, expected mortality could be as high as 20%.  The underlying comorbidities could push mortality from this event even higher.  After discussion, all agree on comfort care for the patient, avoiding invasive procedures, CPR and artificial life support.  IMPRESSION: 84 year old gentleman with dementia who presents with an acute myocardial infarction that started at around  3 AM.  Patient is now pain-free.  After discussion with family we have decided upon conservative medical management and comfort care measures.  PLAN: Admit to telemetry, opiates for analgesia, reconsult hospice in a.m., DNR re-instituted.  Duration of encounter 55 min

## 2018-02-10 NOTE — ED Notes (Addendum)
Attempted to stand pt up with 2 staff assist. Pt unable to use urinal.  Became very sob.  2L [place] and condom cath placed.

## 2018-02-10 NOTE — ED Notes (Signed)
Informed provider of positive trop

## 2018-02-10 NOTE — H&P (View-Only) (Signed)
Cardiology Consultation:   Patient ID: Kenneth Macias; [id number]; 28-Nov-1933   Admit date: 02/10/2018 Date of Consult: 02/10/2018  Primary Care Provider: Charletta Cousin, MD (Inactive) Primary Cardiologist: Kenneth More, MD  Primary Electrophysiologist:     Patient Profile:   Kenneth Macias is a 84 y.o. male with a hx of CAD with prior stent in RCA, last cath 2016 without intervention, DM, Parkinson's, HLD, HTN, paroxysmal atrial flutter and SSS/bradycardia s/p dual chamber PPM (revision of atrial lead after dislodgement in 2018) who is being seen today for the evaluation of chest pain and elevated troponin at the request of Kenneth Macias.  History of Present Illness:   Kenneth Macias last cath was 05/31/15, which showed diffusely calcified RCA with patent distal stent, OM with 60% serial stenoses, LAD severely calcified with mid and distal portions 60-70% stenosis. He was previously deemed not a surgical candidate and no culprit lesion was found for his NSTEMI in 2016 - medical therapy recommended. He was recently seen in clinic on 11/08/17 with Kenneth Macias. At that visit, his orthostatic hypotension was stable (on midodrine and florinef), and his CAD was stable without medication changes. He is followed in device clinic and was seen by Kenneth Macias on 11/20/17 - doing well at that time, device functioning well.   On my interview, the patient is slow to speak due to Parkinson's. Level 5 Caveat. Much of the history gathered is from daughters at bedside. Daughter states he is still groggy from the morphine. Chest pain woke him from sleep this morning at approximately 3AM. Per his daughter, the chest pain was severe, felt like his prior heart attacks (pressure in center of chest), and he requested to go to the hospital - something that is very unusual for him. He also had shortness of breath, but no diaphoresis, N/V. Chest pain was relieved with nitro, ASA, morphine, and heparin. He complains of 2-3/10 chest pain that is  worse if he tries to move around.  He has battled colon cancer over the past year. He underwent surgery, but has had an elevation of his CEA again. It is unclear if cancer has recurred. He is not interested in chemo treatment for his colon cancer. He is not DNR. When asked, he is unsure if he wants a heart catheterization.    Past Medical History:  Diagnosis Date  . Adenocarcinoma of colon ([place]) 01/03/2017  . Alzheimer's disease 01/06/2016  . Atrial flutter ([place]) 12/18/2016  . Chest pain 05/29/2015  . Chronic kidney disease 05/31/2015  . Coronary artery disease involving native coronary artery    Overview:  Overview:  1. PCI and BMS to RCA 2011 2. PCI and DES to RCA 07/30/11  Cardiac cath 05/31/15: Cath 05/31/15 Conclusion  1. Mid RCA to Dist RCA lesion, 20% stenosed. The lesion was previously treated with a stent (unknown type). 2. Prox RCA lesion, 20% stenosed. 3. Mid RCA lesion, 20% stenosed. 4. 1st RPLB lesion, 99% stenosed. 5. Ost RPDA lesion, 60% stenosed. 6. LM lesion, 40% stenosed. 7. Ost Cx to Prox Cx lesion, 40% stenosed. 8. 2nd Mrg lesion, 60% stenosed. 9. Ost LAD to Mid LAD lesion, 30% stenosed. 10. Mid LAD to Dist LAD lesion, 60% stenosed. 11. Ost 2nd Diag to 2nd Diag lesion, 99% stenosed. 12. Ost 1st Diag to 1st Diag lesion, 99% stenosed.  1. Unstable angina 2. The right coronary artery is diffusely calcified. There is mild disease in the proximal and mid segments. The distal stent is patent. Beyond the stent  the posterolateral branches are small in caliber and diffusely diseased. These vessels are too small for PCI.  3. The Circumflex terminates into an obtuse margi  . DM (diabetes mellitus) ([place]) 05/31/2015  . Fall 01/06/2016  . Hyperlipidemia 05/31/2015  . Hypertension   . Hypertensive kidney disease with stage 3 chronic kidney disease ([place]) 04/25/2015  . Leukocytosis 05/11/2016  . Myocardial infarction ([place]) 07/30/2014   Overview:  x2  . NSTEMI (non-ST elevated myocardial infarction) ([place])  05/31/2015  . Orthostatic hypotension 05/11/2016  . Parkinson disease ([place]) 05/31/2015  . Paroxysmal atrial flutter ([place]) 06/11/2016  . Subarachnoid hemorrhage ([place]) 01/06/2016  . Syncope 01/09/2016  . Type 2 diabetes mellitus without complication, without long-term current use of insulin ([place]) 06/10/2016  . Unstable angina pectoris ([place]) 05/31/2015    Past Surgical History:  Procedure Laterality Date  . CARDIAC CATHETERIZATION N/A 05/31/2015   Procedure: Left Heart Cath and Coronary Angiography;  Surgeon: Burnell Blanks, MD;  Location: [facility];  Service: Cardiovascular;  Laterality: N/A;  . COLON RESECTION    . COLONOSCOPY    . CORONARY ANGIOPLASTY WITH STENT PLACEMENT    . HAND SURGERY    . PACEMAKER REVISION N/A 07/11/2017   Procedure: PACEMAKER REVISION;  Surgeon: Constance Haw, MD;  Location: [facility];  Service: Cardiovascular;  Laterality: N/A;     Home Medications:  Prior to Admission medications   Medication Sig Start Date End Date Taking? Authorizing Provider  Carbidopa-Levodopa ER (RYTARY) 61.25-245 MG CPCR Take 1 tablet by mouth See admin instructions. TAKES 2 TABLETS IN THE AM, 2 TABLET AT NOON, 1 TABLET IN THE EVENING 12/30/17  Yes Tat, Eustace Quail, DO  cholecalciferol (VITAMIN D) 1000 UNITS tablet Take 1,000 Units by mouth daily.   Yes [provider]  entacapone (COMTAN) 200 MG tablet Take 1 tablet (200 mg total) by mouth 3 (three) times daily. 12/30/17  Yes Tat, Eustace Quail, DO  Ferrous Sulfate (IRON) 325 (65 Fe) MG TABS Take 2 tablets by mouth daily.   Yes [provider]  fludrocortisone (FLORINEF) 0.1 MG tablet Take 1 tablet (0.1 mg total) by mouth daily. 09/05/17  Yes Richardo Priest, MD  FLUoxetine (PROZAC) 10 MG tablet Take 10 mg by mouth at bedtime.    Yes [provider]  isosorbide mononitrate (IMDUR) 60 MG 24 hr tablet Take 30 mg by mouth daily.   Yes [provider]  LORazepam (ATIVAN) 0.5 MG tablet Take  0.5 mg by mouth every 6 (six) hours as needed for anxiety.   Yes [provider]  pantoprazole (PROTONIX) 40 MG tablet Take 40 mg by mouth daily.   Yes [provider]  ranolazine (RANEXA) 1000 MG SR tablet Take 1 tablet (1,000 mg total) by mouth 2 (two) times daily. 06/24/17 06/24/18 Yes Richardo Priest, MD  AMBULATORY NON FORMULARY MEDICATION Manual Wheelchair [phone] full length removable arm rest Removable elevated leg rest 2 inch cusion  DX: G20 12/26/17   TatEustace Quail, DO    Inpatient Medications: Scheduled Meds:  Continuous Infusions: . heparin 1,000 Units/hr (02/10/18 0807)   PRN Meds:   Allergies:    Allergies  Allergen Reactions  . Ketoconazole Rash    Social History:   Social History   Socioeconomic History  . Marital status: Widowed    Spouse name: Not on file  . Number of children: 4  . Years of education: 61  . Highest education level: Not on file  Occupational History  . 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 Financial resource strain: Not on file  . Food insecurity:    Worry: Not on file    Inability: Not on file  . Transportation needs:    Medical: Not on file    Non-medical: Not on file  Tobacco Use  . Smoking status: Never Smoker  . Smokeless tobacco: Current User    Types: Chew  Substance and Sexual Activity  . Alcohol use: No  . Drug use: No  . Sexual activity: Not on file  Lifestyle  . Physical activity:    Days per week: Not on file    Minutes per session: Not on file  . Stress: Not on file  Relationships  . Social connections:    Talks on phone: Not on file    Gets together: Not on file    Attends religious service: Not on file    Active member of club or organization: Not on file    Attends meetings of clubs or organizations: Not on file    Relationship status: Not on file  . Intimate partner violence:    Fear of current or ex partner: Not on file    Emotionally  abused: Not on file    Physically abused: Not on file    Forced sexual activity: Not on file  Other Topics Concern  . Not on file  Social History Narrative   Patient lives with one of his daughters in a one story home.  Has 4 children.  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  Education: high school.     Family History:    Family History  Problem Relation Age of Onset  . Cancer Mother   . Colon cancer Mother   . Heart attack Father   . Heart disease Father   . Diabetes Father   . CAD Son   . Colon cancer Son   . CAD Daughter   . Heart disease Daughter      ROS:  Please see the history of present illness.   All other ROS reviewed and negative.     Physical Exam/Data:   Vitals:   02/10/18 0845 02/10/18 0900 02/10/18 0915 02/10/18 0930  BP: 129/85 135/89 117/62 129/87  Pulse: 91 94 91 92  Resp: 16 18 16  (!) 21  Temp:      TempSrc:      SpO2: 92% 96% 93% 96%  Weight:      Height:       No intake or output data in the 24 hours ending 02/10/18 1056 Filed Weights   02/10/18 0418  Weight: 185 lb (83.9 kg)   Body mass index is 25.09 kg/m.  General:  Elderly male, slow to speak 2/ Parkinson's HEENT: normal Neck: no JVD Vascular: No carotid bruits Cardiac:  normal S1, S2; RRR; no murmur Lungs:  Respirations unlabored, wheezes throughout, diminished in left base Abd: soft, nontender, no hepatomegaly  Ext: no edema Musculoskeletal:  No deformities, BUE and BLE strength normal and equal Skin: warm and dry  Neuro:  CNs 2-12 intact, no focal abnormalities noted Psych:  Normal affect   EKG:  The EKG was personally reviewed and demonstrates:  V-paced Telemetry:  Telemetry was personally reviewed and demonstrates:  Paced  Relevant CV Studies:  Heart cath 05/31/15:  Mid RCA to Dist RCA lesion, 20% stenosed. The lesion was previously treated with a stent (  unknown type).  Prox RCA lesion, 20% stenosed.  Mid RCA lesion, 20% stenosed.  1st RPLB lesion, 99%  stenosed.  Ost RPDA lesion, 60% stenosed.  LM lesion, 40% stenosed.  Ost Cx to Prox Cx lesion, 40% stenosed.  2nd Mrg lesion, 60% stenosed.  Ost LAD to Mid LAD lesion, 30% stenosed.  Mid LAD to Dist LAD lesion, 60% stenosed.  Ost 2nd Diag to 2nd Diag lesion, 99% stenosed.  Ost 1st Diag to 1st Diag lesion, 99% stenosed.   1. NSTEMI 2. The right coronary artery is diffusely calcified. There is mild disease in the proximal and mid segments. The distal stent is patent. Beyond the stent the posterolateral branches are small in caliber and diffusely diseased. These vessels are too small for PCI.  3. The Circumflex terminates into an obtuse marginal branch. The obtuse marginal branch has 60% serial stenoses followed by diffuse disease in the small caliber distal segment of the obtuse marginal branch.  4. The LAD is severely calcified. The mid and distal vessel has diffuse 60-70% stenosis. Both Diagonal branches are small in caliber and diffusely disease, too small for PCI.  5. No obvious culprit lesions.  6. No focal targets for PCI  Recommendations: Medical management of CAD. He has diffuse CAD with diffusely diseased branch vessels and distal segments of all major epicardial vessels. Anatomy is not favorable for CABG. He is not an operative candidate. No focal targets for PCI. Will admit to stepdown post cath. Will continue IV NTG. Transition to po nitrates tomorrow.    Echo 06/01/15: Study Conclusions - Left ventricle: The cavity size was normal. Wall thickness was   increased in a pattern of moderate LVH. There was focal basal   hypertrophy. Systolic function was normal. The estimated ejection   fraction was in the range of 60% to 65%. Wall motion was normal;   there were no regional wall motion abnormalities. - Mitral valve: Valve area by continuity equation (using LVOT   flow): 2.24 cm^2.   Laboratory Data:  Chemistry Recent Labs  Lab 02/10/18 0532  NA 138  K 3.9  CL 101   CO2 30  GLUCOSE 120*  BUN 16  CREATININE 1.20  CALCIUM 9.1  GFRNONAA 55*  GFRAA >60  ANIONGAP 7    Recent Labs  Lab 02/10/18 0532  PROT 5.9*  ALBUMIN 3.5  AST 28  ALT 7  ALKPHOS 52  BILITOT 0.8   Hematology Recent Labs  Lab 02/10/18 0532  WBC 8.7  RBC 3.99*  HGB 12.4*  HCT 38.3*  MCV 96.0  MCH 31.1  MCHC 32.4  RDW 12.4  PLT 166   Cardiac EnzymesNo results for input(s): TROPONINI in the last 168 hours.  Recent Labs  Lab 02/10/18 0542  TROPIPOC 1.29*    BNPNo results for input(s): BNP, PROBNP in the last 168 hours.  DDimer No results for input(s): DDIMER in the last 168 hours.  Radiology/Studies:  Dg Chest 2 View  Result Date: 02/10/2018 CLINICAL DATA:  Chest pain EXAM: CHEST - 2 VIEW COMPARISON:  07/12/2017 FINDINGS: Left chest wall 2 lead pacemaker is unchanged. Mild cardiomegaly. Small left pleural effusion with associated atelectasis. No pulmonary edema. IMPRESSION: No acute airspace disease.  Small left pleural effusion suspected. Electronically Signed   By: Ulyses Jarred M.D.   On: 02/10/2018 05:47    Assessment and Plan:   1. Chest pain, elevated troponin, CAD with history of stents, last cath 2016 without culprit lesion identified - troponin 1.29 -  0542, chest pain started at approximately 0300 - EKG V-paced - on ranexa and imdur at home This is a difficult situation given his hospice status related to his colon cancer. He is not actively DNR. Patient is unsure if he wants to repeat a heart catheterization at this time.  Continue heparin drip and trend troponins. Will obtain echocardiogram. Gentle hydration today. Given his CAD history, Parkinson's, and CKD stage III, nuclear medicine stress test may have little yield and he is not a candidate for CT coronary. He is able to rest flat, but can't walk. He uses a wheelchair at home.  Case discussed with Dr. Johnsie Cancel. Will continue heparin drip, gently hydrate today, and tentatively plan for heart cath  tomorrow. Will start low dose beta blocker.    2. Hypoxia, left pleural effusion - this is new for him - last echo with normal LVEF 2016 - will repeat echo - gentle with fluids   3. CKD stage III secondary to hypertension - sCr 1.20, baseline appears to be 1.26   4. Paroxysmal atrial flutter, SSS s/p dual chamber PPM - no anticoagulation given his frequent falls and intracranial hemorrhage - V-paced on EKG   Patient is scheduled for left heart cath, no LV gram, at 7:30 tomorrow morning with Dr. Claiborne Billings. NPO at MN please.   For questions or updates, please contact [facility] Please consult [email or web address] for contact info under Cardiology/STEMI.   Signed, Tami Lin Duke, PA  02/10/2018 10:56 AM

## 2018-02-10 NOTE — ED Notes (Signed)
Daughter Candy's [phone]

## 2018-02-10 NOTE — Telephone Encounter (Signed)
Referral faxed to [facility] at [phone].

## 2018-02-10 NOTE — Telephone Encounter (Signed)
We can send referral (we won't be the one managing patient via hospice if qualified though).  Pt can also self refer for hospice eval

## 2018-02-10 NOTE — ED Notes (Signed)
Patient transported to X-ray 

## 2018-02-10 NOTE — Consult Note (Addendum)
Cardiology Consultation:   Patient ID: Kenneth Macias; [id number]; [birth year]   Admit date: 02/10/2018 Date of Consult: 02/10/2018  Primary Care Provider: Charletta Cousin, MD (Inactive) Primary Cardiologist: [name], MD  Primary Electrophysiologist:     Patient Profile:   Kenneth Macias is a 84 y.o. male with a hx of CAD with prior stent in RCA, last cath 2016 without intervention, DM, Parkinson's, HLD, HTN, paroxysmal atrial flutter and SSS/bradycardia s/p dual chamber PPM (revision of atrial lead after dislodgement in 2018) who is being seen today for the evaluation of chest pain and elevated troponin at the request of Dr. Stark Jock.  History of Present Illness:   Mr. Kenneth Macias last cath was 05/31/15, which showed diffusely calcified RCA with patent distal stent, OM with 60% serial stenoses, LAD severely calcified with mid and distal portions 60-70% stenosis. He was previously deemed not a surgical candidate and no culprit lesion was found for his NSTEMI in 2016 - medical therapy recommended. He was recently seen in clinic on 11/08/17 with Dr. [name]. At that visit, his orthostatic hypotension was stable (on midodrine and florinef), and his CAD was stable without medication changes. He is followed in device clinic and was seen by Dr. Curt Bears on 11/20/17 - doing well at that time, device functioning well.   On my interview, the patient is slow to speak due to Parkinson's. Level 5 Caveat. Much of the history gathered is from daughters at bedside. Daughter states he is still groggy from the morphine. Chest pain woke him from sleep this morning at approximately 3AM. Per his daughter, the chest pain was severe, felt like his prior heart attacks (pressure in center of chest), and he requested to go to the hospital - something that is very unusual for him. He also had shortness of breath, but no diaphoresis, N/V. Chest pain was relieved with nitro, ASA, morphine, and heparin. He complains of 2-3/10 chest pain that is  worse if he tries to move around.  He has battled colon cancer over the past year. He underwent surgery, but has had an elevation of his CEA again. It is unclear if cancer has recurred. He is not interested in chemo treatment for his colon cancer. He is not DNR. When asked, he is unsure if he wants a heart catheterization.    Past Medical History:  Diagnosis Date  . Adenocarcinoma of colon ([place]) 01/03/2017  . Alzheimer's disease 01/06/2016  . Atrial flutter ([place]) 12/18/2016  . Chest pain 05/29/2015  . Chronic kidney disease 05/31/2015  . Coronary artery disease involving native coronary artery    Overview:  Overview:  1. PCI and BMS to RCA 2011 2. PCI and DES to RCA 07/30/11  Cardiac cath 05/31/15: Cath 05/31/15 Conclusion  1. Mid RCA to Dist RCA lesion, 20% stenosed. The lesion was previously treated with a stent (unknown type). 2. Prox RCA lesion, 20% stenosed. 3. Mid RCA lesion, 20% stenosed. 4. 1st RPLB lesion, 99% stenosed. 5. Ost RPDA lesion, 60% stenosed. 6. LM lesion, 40% stenosed. 7. Ost Cx to Prox Cx lesion, 40% stenosed. 8. 2nd Mrg lesion, 60% stenosed. 9. Ost LAD to Mid LAD lesion, 30% stenosed. 10. Mid LAD to Dist LAD lesion, 60% stenosed. 11. Ost 2nd Diag to 2nd Diag lesion, 99% stenosed. 12. Ost 1st Diag to 1st Diag lesion, 99% stenosed.  1. Unstable angina 2. The right coronary artery is diffusely calcified. There is mild disease in the proximal and mid segments. The distal stent is patent. Beyond the stent  the posterolateral branches are small in caliber and diffusely diseased. These vessels are too small for PCI.  3. The Circumflex terminates into an obtuse margi  . DM (diabetes mellitus) ([place]) 05/31/2015  . Fall 01/06/2016  . Hyperlipidemia 05/31/2015  . Hypertension   . Hypertensive kidney disease with stage 3 chronic kidney disease ([place]) 04/25/2015  . Leukocytosis 05/11/2016  . Myocardial infarction ([place]) 07/30/2014   Overview:  x2  . NSTEMI (non-ST elevated myocardial infarction) ([place])  05/31/2015  . Orthostatic hypotension 05/11/2016  . Parkinson disease ([place]) 05/31/2015  . Paroxysmal atrial flutter ([place]) 06/11/2016  . Subarachnoid hemorrhage ([place]) 01/06/2016  . Syncope 01/09/2016  . Type 2 diabetes mellitus without complication, without long-term current use of insulin ([place]) 06/10/2016  . Unstable angina pectoris ([place]) 05/31/2015    Past Surgical History:  Procedure Laterality Date  . CARDIAC CATHETERIZATION N/A 05/31/2015   Procedure: Left Heart Cath and Coronary Angiography;  Surgeon: Burnell Blanks, MD;  Location: [facility];  Service: Cardiovascular;  Laterality: N/A;  . COLON RESECTION    . COLONOSCOPY    . CORONARY ANGIOPLASTY WITH STENT PLACEMENT    . HAND SURGERY    . PACEMAKER REVISION N/A 07/11/2017   Procedure: PACEMAKER REVISION;  Surgeon: Constance Haw, MD;  Location: [facility];  Service: Cardiovascular;  Laterality: N/A;     Home Medications:  Prior to Admission medications   Medication Sig Start Date End Date Taking? Authorizing Provider  Carbidopa-Levodopa ER (RYTARY) 61.25-245 MG CPCR Take 1 tablet by mouth See admin instructions. TAKES 2 TABLETS IN THE AM, 2 TABLET AT NOON, 1 TABLET IN THE EVENING 12/30/17  Yes Tat, Eustace Quail, DO  cholecalciferol (VITAMIN D) 1000 UNITS tablet Take 1,000 Units by mouth daily.   Yes [provider]  entacapone (COMTAN) 200 MG tablet Take 1 tablet (200 mg total) by mouth 3 (three) times daily. 12/30/17  Yes Tat, Eustace Quail, DO  Ferrous Sulfate (IRON) 325 (65 Fe) MG TABS Take 2 tablets by mouth daily.   Yes [provider]  fludrocortisone (FLORINEF) 0.1 MG tablet Take 1 tablet (0.1 mg total) by mouth daily. 09/05/17  Yes Richardo Priest, MD  FLUoxetine (PROZAC) 10 MG tablet Take 10 mg by mouth at bedtime.    Yes [provider]  isosorbide mononitrate (IMDUR) 60 MG 24 hr tablet Take 30 mg by mouth daily.   Yes [provider]  LORazepam (ATIVAN) 0.5 MG tablet Take  0.5 mg by mouth every 6 (six) hours as needed for anxiety.   Yes [provider]  pantoprazole (PROTONIX) 40 MG tablet Take 40 mg by mouth daily.   Yes [provider]  ranolazine (RANEXA) 1000 MG SR tablet Take 1 tablet (1,000 mg total) by mouth 2 (two) times daily. 06/24/17 06/24/18 Yes Richardo Priest, MD  AMBULATORY NON FORMULARY MEDICATION Manual Wheelchair [phone] full length removable arm rest Removable elevated leg rest 2 inch cusion  DX: G20 12/26/17   TatEustace Quail, DO    Inpatient Medications: Scheduled Meds:  Continuous Infusions: . heparin 1,000 Units/hr (02/10/18 0807)   PRN Meds:   Allergies:    Allergies  Allergen Reactions  . Ketoconazole Rash    Social History:   Social History   Socioeconomic History  . Marital status: Widowed    Spouse name: Not on file  . Number of children: 4  . Years of education: 25  . Highest education level: Not on file  Occupational History  . 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 Financial resource strain: Not on file  . Food insecurity:    Worry: Not on file    Inability: Not on file  . Transportation needs:    Medical: Not on file    Non-medical: Not on file  Tobacco Use  . Smoking status: Never Smoker  . Smokeless tobacco: Current User    Types: Chew  Substance and Sexual Activity  . Alcohol use: No  . Drug use: No  . Sexual activity: Not on file  Lifestyle  . Physical activity:    Days per week: Not on file    Minutes per session: Not on file  . Stress: Not on file  Relationships  . Social connections:    Talks on phone: Not on file    Gets together: Not on file    Attends religious service: Not on file    Active member of club or organization: Not on file    Attends meetings of clubs or organizations: Not on file    Relationship status: Not on file  . Intimate partner violence:    Fear of current or ex partner: Not on file    Emotionally  abused: Not on file    Physically abused: Not on file    Forced sexual activity: Not on file  Other Topics Concern  . Not on file  Social History Narrative   Patient lives with one of his daughters in a one story home.  Has 4 children.  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  Education: high school.     Family History:    Family History  Problem Relation Age of Onset  . Cancer Mother   . Colon cancer Mother   . Heart attack Father   . Heart disease Father   . Diabetes Father   . CAD Son   . Colon cancer Son   . CAD Daughter   . Heart disease Daughter      ROS:  Please see the history of present illness.   All other ROS reviewed and negative.     Physical Exam/Data:   Vitals:   02/10/18 0845 02/10/18 0900 02/10/18 0915 02/10/18 0930  BP: 129/85 135/89 117/62 129/87  Pulse: 91 94 91 92  Resp: 16 18 16  (!) 21  Temp:      TempSrc:      SpO2: 92% 96% 93% 96%  Weight:      Height:       No intake or output data in the 24 hours ending 02/10/18 1056 Filed Weights   02/10/18 0418  Weight: 185 lb (83.9 kg)   Body mass index is 25.09 kg/m.  General:  Elderly male, slow to speak 2/ Parkinson's HEENT: normal Neck: no JVD Vascular: No carotid bruits Cardiac:  normal S1, S2; RRR; no murmur Lungs:  Respirations unlabored, wheezes throughout, diminished in left base Abd: soft, nontender, no hepatomegaly  Ext: no edema Musculoskeletal:  No deformities, BUE and BLE strength normal and equal Skin: warm and dry  Neuro:  CNs 2-12 intact, no focal abnormalities noted Psych:  Normal affect   EKG:  The EKG was personally reviewed and demonstrates:  V-paced Telemetry:  Telemetry was personally reviewed and demonstrates:  Paced  Relevant CV Studies:  Heart cath 05/31/15:  Mid RCA to Dist RCA lesion, 20% stenosed. The lesion was previously treated with a stent (  unknown type).  Prox RCA lesion, 20% stenosed.  Mid RCA lesion, 20% stenosed.  1st RPLB lesion, 99%  stenosed.  Ost RPDA lesion, 60% stenosed.  LM lesion, 40% stenosed.  Ost Cx to Prox Cx lesion, 40% stenosed.  2nd Mrg lesion, 60% stenosed.  Ost LAD to Mid LAD lesion, 30% stenosed.  Mid LAD to Dist LAD lesion, 60% stenosed.  Ost 2nd Diag to 2nd Diag lesion, 99% stenosed.  Ost 1st Diag to 1st Diag lesion, 99% stenosed.   1. NSTEMI 2. The right coronary artery is diffusely calcified. There is mild disease in the proximal and mid segments. The distal stent is patent. Beyond the stent the posterolateral branches are small in caliber and diffusely diseased. These vessels are too small for PCI.  3. The Circumflex terminates into an obtuse marginal branch. The obtuse marginal branch has 60% serial stenoses followed by diffuse disease in the small caliber distal segment of the obtuse marginal branch.  4. The LAD is severely calcified. The mid and distal vessel has diffuse 60-70% stenosis. Both Diagonal branches are small in caliber and diffusely disease, too small for PCI.  5. No obvious culprit lesions.  6. No focal targets for PCI  Recommendations: Medical management of CAD. He has diffuse CAD with diffusely diseased branch vessels and distal segments of all major epicardial vessels. Anatomy is not favorable for CABG. He is not an operative candidate. No focal targets for PCI. Will admit to stepdown post cath. Will continue IV NTG. Transition to po nitrates tomorrow.    Echo 06/01/15: Study Conclusions - Left ventricle: The cavity size was normal. Wall thickness was   increased in a pattern of moderate LVH. There was focal basal   hypertrophy. Systolic function was normal. The estimated ejection   fraction was in the range of 60% to 65%. Wall motion was normal;   there were no regional wall motion abnormalities. - Mitral valve: Valve area by continuity equation (using LVOT   flow): 2.24 cm^2.   Laboratory Data:  Chemistry Recent Labs  Lab 02/10/18 0532  NA 138  K 3.9  CL 101   CO2 30  GLUCOSE 120*  BUN 16  CREATININE 1.20  CALCIUM 9.1  GFRNONAA 55*  GFRAA >60  ANIONGAP 7    Recent Labs  Lab 02/10/18 0532  PROT 5.9*  ALBUMIN 3.5  AST 28  ALT 7  ALKPHOS 52  BILITOT 0.8   Hematology Recent Labs  Lab 02/10/18 0532  WBC 8.7  RBC 3.99*  HGB 12.4*  HCT 38.3*  MCV 96.0  MCH 31.1  MCHC 32.4  RDW 12.4  PLT 166   Cardiac EnzymesNo results for input(s): TROPONINI in the last 168 hours.  Recent Labs  Lab 02/10/18 0542  TROPIPOC 1.29*    BNPNo results for input(s): BNP, PROBNP in the last 168 hours.  DDimer No results for input(s): DDIMER in the last 168 hours.  Radiology/Studies:  Dg Chest 2 View  Result Date: 02/10/2018 CLINICAL DATA:  Chest pain EXAM: CHEST - 2 VIEW COMPARISON:  07/12/2017 FINDINGS: Left chest wall 2 lead pacemaker is unchanged. Mild cardiomegaly. Small left pleural effusion with associated atelectasis. No pulmonary edema. IMPRESSION: No acute airspace disease.  Small left pleural effusion suspected. Electronically Signed   By: Ulyses Jarred M.D.   On: 02/10/2018 05:47    Assessment and Plan:   1. Chest pain, elevated troponin, CAD with history of stents, last cath 2016 without culprit lesion identified - troponin 1.29 -  0542, chest pain started at approximately 0300 - EKG V-paced - on ranexa and imdur at home This is a difficult situation given his hospice status related to his colon cancer. He is not actively DNR. Patient is unsure if he wants to repeat a heart catheterization at this time.  Continue heparin drip and trend troponins. Will obtain echocardiogram. Gentle hydration today. Given his CAD history, Parkinson's, and CKD stage III, nuclear medicine stress test may have little yield and he is not a candidate for CT coronary. He is able to rest flat, but can't walk. He uses a wheelchair at home.  Case discussed with Dr. Johnsie Cancel. Will continue heparin drip, gently hydrate today, and tentatively plan for heart cath  tomorrow. Will start low dose beta blocker.    2. Hypoxia, left pleural effusion - this is new for him - last echo with normal LVEF 2016 - will repeat echo - gentle with fluids   3. CKD stage III secondary to hypertension - sCr 1.20, baseline appears to be 1.26   4. Paroxysmal atrial flutter, SSS s/p dual chamber PPM - no anticoagulation given his frequent falls and intracranial hemorrhage - V-paced on EKG   Patient is scheduled for left heart cath, no LV gram, at 7:30 tomorrow morning with Dr. Claiborne Billings. NPO at MN please.   For questions or updates, please contact [facility] Please consult [email or web address] for contact info under Cardiology/STEMI.   Signed, Tami Lin Duke, PA  02/10/2018 10:56 AM

## 2018-02-10 NOTE — H&P (Addendum)
Date: 02/10/2018               Patient Name:  Kenneth Macias MRN: [id number]  DOB: 1934-03-25 Age / Sex: 84 y.o., male   PCP: Charletta Cousin, MD (Inactive)              Medical Service: Internal Medicine Teaching Service              Attending Physician: Dr. Lynnae January, Real Cons, MD    First Contact: Tommye Standard, MS4 Pager: [phone]  Second Contact: Dr. Lorella Nimrod Pager: [phone]  Third Contact Dr. Dareen Piano         After Hours (After 5p/  First Contact Pager: [phone]  weekends / holidays): Second Contact Pager: [phone]   Chief Complaint: chest pain  History of Present Illness: [name] is an 84 year old male with PMHx significant for 3 reported previous NTEMIs s/p DES and BMS both in the RCA, severe CAD on last cath in 2016, Sick Sinus syndrome s/p bi-ventricular pacemaker, paroxysmal Aflutter, Parkinsons, dementia, hx subarachnoid hemorrhage, CKD III, and colon cancer for which, he is receiving hospice care at home presenting today with left sided chest pain. Chest pain starting at 3am this morning that woke him up from his sleep. Pain is non radiating, denies pain in arms, neck, jaw or abdomen. Pt endorses chest pressure, SOB, and states this feels similar to his previous heart attacks. Per EMS report, pts chest pain improved with nitro in the ambulance. Pt denies nausea, and diaphoresis. He is able to answer most questions, however does appear confused, which daughter present state is consistent with baseline dementia. Last week pt was started with home hospice due to what appears to be a return of colon cancer following colectomy years prior. Pts daughter described a rising CEA and pt wished to not undergo any further testing, therefore they decided to move forward with hospice. Pt has living will however it does not specify code status. Pt is unable to discuss at this time. Family wishes to discuss, therefore pt is currently full code.  Pt has strong cardiac history including 4 previous  ACS events. Pts first MI was 2003. Most recent MI 2016. Pt had PCI and BMS placed to RCA in 2011, and PCI with DES to RCA in 2012. Last cardiac cath in 2016 showed severe calcification of the RCA and LAD with diffuse CAD with diffusely diseased branch vessels without focal targets for PCI, and anatomy unfavorable for CABG.  In the ED, pt started on heparin drip, s/p nitroglycerin, NS at 75/hr, and morphine. Cardiac has evaluated and plans to take pt to cath tonight d/t troponin of 36.  Meds:  Current Meds  Medication Sig  . Carbidopa-Levodopa ER (RYTARY) 61.25-245 MG CPCR Take 1 tablet by mouth See admin instructions. TAKES 2 TABLETS IN THE AM, 2 TABLET AT NOON, 1 TABLET IN THE EVENING  . cholecalciferol (VITAMIN D) 1000 UNITS tablet Take 1,000 Units by mouth daily.  . entacapone (COMTAN) 200 MG tablet Take 1 tablet (200 mg total) by mouth 3 (three) times daily.  . Ferrous Sulfate (IRON) 325 (65 Fe) MG TABS Take 2 tablets by mouth daily.  . fludrocortisone (FLORINEF) 0.1 MG tablet Take 1 tablet (0.1 mg total) by mouth daily.  Marland Kitchen FLUoxetine (PROZAC) 10 MG tablet Take 10 mg by mouth at bedtime.   . isosorbide mononitrate (IMDUR) 60 MG 24 hr tablet Take 30 mg by mouth daily.  Marland Kitchen LORazepam (ATIVAN) 0.5 MG tablet Take 0.5  mg by mouth every 6 (six) hours as needed for anxiety.  . pantoprazole (PROTONIX) 40 MG tablet Take 40 mg by mouth daily.  . ranolazine (RANEXA) 1000 MG SR tablet Take 1 tablet (1,000 mg total) by mouth 2 (two) times daily.     Allergies: Allergies as of 02/10/2018 - Review Complete 02/10/2018  Allergen Reaction Noted  . Ketoconazole Rash 02/10/2018   Past Medical History:  Diagnosis Date  . Adenocarcinoma of colon ([place]) 01/03/2017  . Alzheimer's disease 01/06/2016  . Atrial flutter ([place]) 12/18/2016  . Chest pain 05/29/2015  . Chronic kidney disease 05/31/2015  . Coronary artery disease involving native coronary artery    Overview:  Overview:  1. PCI and BMS to RCA 2011 2. PCI  and DES to RCA 07/30/11  Cardiac cath 05/31/15: Cath 05/31/15 Conclusion  1. Mid RCA to Dist RCA lesion, 20% stenosed. The lesion was previously treated with a stent (unknown type). 2. Prox RCA lesion, 20% stenosed. 3. Mid RCA lesion, 20% stenosed. 4. 1st RPLB lesion, 99% stenosed. 5. Ost RPDA lesion, 60% stenosed. 6. LM lesion, 40% stenosed. 7. Ost Cx to Prox Cx lesion, 40% stenosed. 8. 2nd Mrg lesion, 60% stenosed. 9. Ost LAD to Mid LAD lesion, 30% stenosed. 10. Mid LAD to Dist LAD lesion, 60% stenosed. 11. Ost 2nd Diag to 2nd Diag lesion, 99% stenosed. 12. Ost 1st Diag to 1st Diag lesion, 99% stenosed.  1. Unstable angina 2. The right coronary artery is diffusely calcified. There is mild disease in the proximal and mid segments. The distal stent is patent. Beyond the stent the posterolateral branches are small in caliber and diffusely diseased. These vessels are too small for PCI.  3. The Circumflex terminates into an obtuse margi  . DM (diabetes mellitus) ([place]) 05/31/2015  . Fall 01/06/2016  . Hyperlipidemia 05/31/2015  . Hypertension   . Hypertensive kidney disease with stage 3 chronic kidney disease ([place]) 04/25/2015  . Leukocytosis 05/11/2016  . Myocardial infarction ([place]) 07/30/2014   Overview:  x2  . NSTEMI (non-ST elevated myocardial infarction) ([place]) 05/31/2015  . Orthostatic hypotension 05/11/2016  . Parkinson disease ([place]) 05/31/2015  . Paroxysmal atrial flutter ([place]) 06/11/2016  . Subarachnoid hemorrhage ([place]) 01/06/2016  . Syncope 01/09/2016  . Type 2 diabetes mellitus without complication, without long-term current use of insulin ([place]) 06/10/2016  . Unstable angina pectoris ([place]) 05/31/2015    Family History:  MI - father, and all 4 children  Social History: Lives at home with youngest daughter in [place], [place]. Pt recently started receiving home hospice care d/t progression of colon cancer that pt does not wish to have treated. Denies lifelong history of tobacco, alcohol, or drug use.  Review  of Systems: A complete ROS was negative except as per HPI.   Physical Exam: Blood pressure (!) 145/92, pulse 95, temperature 97.8 F (36.6 C), temperature source Oral, resp. rate 20, height 6' (1.829 m), weight 83.9 kg (185 lb), SpO2 97 %. Gen: lying in bed, able to speak words, but not full sentences HEENT: EOMI, PERRL, MMM, no LAD Cardio: RRR, S1, S2, no gallops 2+ radial pulses bilaterally Resp: LCAB, increased WOB with audible inspiratory stridor Abdomen: soft, non-tender, non-distended, hypoactive BS Extremities: warm, no pitting edema Neuro: A&O to person only, not to time or place. Normal speech Psychiatric: pleasant, cooperative, unable to answer all questions  Pertinent Labs: Troponin 1.29 --> 36 Hgb 12.4 Cr 1.2 Glucose 120  EKG: personally reviewed my interpretation is this EKG is unreliable given pt  is ventriularly paced with LBBB.  Last Catheterization 05/2015:  1. NSTEMI 2. The right coronary artery is diffusely calcified. There is mild disease in the proximal and mid segments. The distal stent is patent. Beyond the stent the posterolateral branches are small in caliber and diffusely diseased. These vessels are too small for PCI.  3. The Circumflex terminates into an obtuse marginal branch. The obtuse marginal branch has 60% serial stenoses followed by diffuse disease in the small caliber distal segment of the obtuse marginal branch.  4. The LAD is severely calcified. The mid and distal vessel has diffuse 60-70% stenosis. Both Diagonal branches are small in caliber and diffusely disease, too small for PCI.  5. No obvious culprit lesions.  6. No focal targets for PCI  He has diffuse CAD with diffusely diseased branch vessels and distal segments of all major epicardial vessels. Anatomy is not favorable for CABG. He is not an operative candidate. No focal targets for PCI  TTE 05/2015: Left ventricle: The cavity size was normal. Wall thickness was   increased in a pattern  of moderate LVH. There was focal basal   hypertrophy. Systolic function was normal. The estimated ejection   fraction was in the range of 60% to 65%. Wall motion was normal;   there were no regional wall motion abnormalities.   CXR: personally reviewed my interpretation is small L sided pleural effusion   Assessment & Plan by Problem: Mr. Honse is an 84 year old male with PMHx significant for 3 reported previous NTEMIs s/p DES and BMS both in the RCA, severe CAD on last cath in 2016, Sick Sinus syndrome s/p bi-ventricular pacemaker, paroxysmal Aflutter, Parkinsons, dementia, hx subarachnoid hemorrhage, CKD III, and colon cancer for which, he is receiving hospice care at home presenting today with left sided chest pain, pressure, and SOB with very high troponin elevation significant for NSTEMI. Pt is under home hospice care, however pts 2 daughters present at bedside wish for pt to undergo cath to see if there is a reversible lesion. Upon our discussion with pt, daughters wished for pt to remain full code until more family will arrive in the AM.   #NTEMI  -troponin elevated to 36 -s/p nitroglycerin, which improved pain -s/p morphine Plan: -PCI today per cardiology -telemetry -TTE completed, read pending -NS 75/hr -heparin gtt -metoprolol tartrate 12.5mg  BID -continue home ranolazine (anti-anginal agent)  #Parkinsons ~10 year hx Plan: -continue Rytary, entacapone -continue Prozac -continue Ativan PRN anxiety  #Autonomic Instability -associated with Parkinsons Plan: -continue fludrocortisone   #Paroxysmal Aflutter -telemetry  #T2DM -no meds -daily BG checks -carb modified diet  #DVT Ppx:  heparin  #Dispo: Inpatient with expected LOS >48 hrs.   Signed: Christin Bach, Medical Student 02/10/2018, 3:10 PM  Pager: [phone]  Attestation for Student Documentation:  I personally was present and performed or re-performed the history, physical exam and medical  decision-making activities of this service and have verified that the service and findings are accurately documented in the student's note.  Interval History: Family has now decided against LHC, and to focus on more comfort measures. He is now full DNR, continues to be full scope of care. Will further address goal of care in the morning.   Velna Ochs, MD 02/10/2018, 8:06 PM

## 2018-02-10 NOTE — ED Triage Notes (Signed)
Pt BIB [facility] d/t Pt awaken w/ 6/10 L.sided Non radiating CP. Pt was given 324 ASA, 1 Nitro.  Pt has a Hx of Cardiac Stent, Pacemaker. Pt is on Hospice and not a DNR

## 2018-02-10 NOTE — ED Provider Notes (Signed)
  Physical Exam  BP (!) 145/92   Pulse 95   Temp 97.8 F (36.6 C) (Oral)   Resp 20   Ht 6' (1.829 m)   Wt 83.9 kg (185 lb)   SpO2 97%   BMI 25.09 kg/m   Physical Exam  ED Course/Procedures     Procedures  MDM  Patient here with positive troponin. Started on heparin. Cardiology saw patient and plans for cath in AM. However, given that patient has severe dementia, they recommend admission to medicine service. IM teaching service to admit for unstable angina.   CRITICAL CARE Performed by: Wandra Arthurs   Total critical care time: 30 minutes  Critical care time was exclusive of separately billable procedures and treating other patients.  Critical care was necessary to treat or prevent imminent or life-threatening deterioration.  Critical care was time spent personally by me on the following activities: development of treatment plan with patient and/or surrogate as well as nursing, discussions with consultants, evaluation of patient's response to treatment, examination of patient, obtaining history from patient or surrogate, ordering and performing treatments and interventions, ordering and review of laboratory studies, ordering and review of radiographic studies, pulse oximetry and re-evaluation of patient's condition.    Drenda Freeze, MD 02/10/18 1356

## 2018-02-10 NOTE — ED Notes (Signed)
Family gave pt his daily parkinsons medications with permission of Dr Stark Jock.  Carbidopa/Levodopa 61.25/245 mg (2 tabs) and entacapone 200 mg (1 tab).

## 2018-02-10 NOTE — Interval H&P Note (Signed)
Cath Lab Visit (complete for each Cath Lab visit)  Clinical Evaluation Leading to the Procedure:   ACS: Yes.    Non-ACS:    Anginal Classification: CCS IV  Anti-ischemic medical therapy: Minimal Therapy (1 class of medications)  Non-Invasive Test Results: No non-invasive testing performed  Prior CABG: No previous CABG      History and Physical Interval Note:  02/10/2018 5:29 PM  Jeneen Rinks Sligar  has presented today for surgery, with the diagnosis of cp  The various methods of treatment have been discussed with the patient and family. After consideration of risks, benefits and other options for treatment, the patient has consented to  Procedure(s): LEFT HEART CATH AND CORONARY ANGIOGRAPHY (N/A) as a surgical intervention .  The patient's history has been reviewed, patient examined, no change in status, stable for surgery.  I have reviewed the patient's chart and labs.  Questions were answered to the patient's satisfaction.     Belva Crome III

## 2018-02-11 ENCOUNTER — Other Ambulatory Visit (HOSPITAL_COMMUNITY): Payer: Medicare Other

## 2018-02-11 DIAGNOSIS — I219 Acute myocardial infarction, unspecified: Secondary | ICD-10-CM

## 2018-02-11 DIAGNOSIS — Z888 Allergy status to other drugs, medicaments and biological substances status: Secondary | ICD-10-CM

## 2018-02-11 DIAGNOSIS — N183 Chronic kidney disease, stage 3 (moderate): Secondary | ICD-10-CM

## 2018-02-11 DIAGNOSIS — C189 Malignant neoplasm of colon, unspecified: Secondary | ICD-10-CM

## 2018-02-11 DIAGNOSIS — I200 Unstable angina: Secondary | ICD-10-CM

## 2018-02-11 DIAGNOSIS — Z955 Presence of coronary angioplasty implant and graft: Secondary | ICD-10-CM

## 2018-02-11 DIAGNOSIS — Z950 Presence of cardiac pacemaker: Secondary | ICD-10-CM

## 2018-02-11 DIAGNOSIS — Z79899 Other long term (current) drug therapy: Secondary | ICD-10-CM

## 2018-02-11 DIAGNOSIS — Z9049 Acquired absence of other specified parts of digestive tract: Secondary | ICD-10-CM

## 2018-02-11 DIAGNOSIS — I2510 Atherosclerotic heart disease of native coronary artery without angina pectoris: Secondary | ICD-10-CM

## 2018-02-11 DIAGNOSIS — I480 Paroxysmal atrial fibrillation: Secondary | ICD-10-CM

## 2018-02-11 DIAGNOSIS — E1122 Type 2 diabetes mellitus with diabetic chronic kidney disease: Secondary | ICD-10-CM

## 2018-02-11 DIAGNOSIS — I252 Old myocardial infarction: Secondary | ICD-10-CM

## 2018-02-11 DIAGNOSIS — Z87820 Personal history of traumatic brain injury: Secondary | ICD-10-CM

## 2018-02-11 LAB — CBC
HEMATOCRIT: 41.3 % (ref 39.0–52.0)
Hemoglobin: 13.5 g/dL (ref 13.0–17.0)
MCH: 31.2 pg (ref 26.0–34.0)
MCHC: 32.7 g/dL (ref 30.0–36.0)
MCV: 95.4 fL (ref 78.0–100.0)
Platelets: 192 10*3/uL (ref 150–400)
RBC: 4.33 MIL/uL (ref 4.22–5.81)
RDW: 12.5 % (ref 11.5–15.5)
WBC: 12.8 10*3/uL — AB (ref 4.0–10.5)

## 2018-02-11 LAB — BASIC METABOLIC PANEL
Anion gap: 11 (ref 5–15)
BUN: 19 mg/dL (ref 8–23)
CALCIUM: 9.2 mg/dL (ref 8.9–10.3)
CO2: 25 mmol/L (ref 22–32)
Chloride: 102 mmol/L (ref 98–111)
Creatinine, Ser: 1.33 mg/dL — ABNORMAL HIGH (ref 0.61–1.24)
GFR calc Af Amer: 56 mL/min — ABNORMAL LOW (ref >60)
GFR, EST NON AFRICAN AMERICAN: 48 mL/min — AB (ref >60)
Glucose, Bld: 138 mg/dL — ABNORMAL HIGH (ref 70–99)
POTASSIUM: 4.3 mmol/L (ref 3.5–5.1)
Sodium: 138 mmol/L (ref 135–145)

## 2018-02-11 LAB — HEPARIN LEVEL (UNFRACTIONATED)
Heparin Unfractionated: 0.14 IU/mL — ABNORMAL LOW (ref 0.30–0.70)
Heparin Unfractionated: 0.32 IU/mL (ref 0.30–0.70)

## 2018-02-11 LAB — TROPONIN I: TROPONIN I: 44.18 ng/mL — AB (ref <0.03)

## 2018-02-11 MED ORDER — MORPHINE SULFATE (PF) 2 MG/ML IV SOLN
1.0000 mg | Freq: Once | INTRAVENOUS | Status: AC
  Administered 2018-02-11: 1 mg via INTRAVENOUS
  Filled 2018-02-11: qty 1

## 2018-02-11 MED ORDER — LORAZEPAM 2 MG/ML IJ SOLN
1.0000 mg | INTRAMUSCULAR | Status: DC
  Administered 2018-02-11: 1 mg via INTRAVENOUS
  Filled 2018-02-11: qty 1

## 2018-02-11 MED ORDER — LORAZEPAM 2 MG/ML IJ SOLN
0.5000 mg | Freq: Four times a day (QID) | INTRAMUSCULAR | Status: DC | PRN
  Administered 2018-02-11: 0.5 mg via INTRAVENOUS
  Filled 2018-02-11: qty 1

## 2018-02-11 MED ORDER — GLYCOPYRROLATE 1 MG PO TABS: 1.0000 mg | ORAL_TABLET | ORAL | Status: DC | PRN

## 2018-02-11 MED ORDER — HYDROMORPHONE HCL 1 MG/ML IJ SOLN: 1.0000 mg | INTRAMUSCULAR | 0 refills | Status: AC

## 2018-02-11 MED ORDER — GLYCOPYRROLATE 0.2 MG/ML IJ SOLN: 0.2000 mg | INTRAMUSCULAR | Status: AC | PRN

## 2018-02-11 MED ORDER — HYDROMORPHONE HCL 1 MG/ML IJ SOLN: 0.5000 mg | INTRAMUSCULAR | 0 refills | Status: AC | PRN

## 2018-02-11 MED ORDER — LORAZEPAM 2 MG/ML IJ SOLN: 1.0000 mg | INTRAMUSCULAR | 0 refills | Status: AC

## 2018-02-11 MED ORDER — ACETAMINOPHEN 650 MG RE SUPP: 650.0000 mg | Freq: Four times a day (QID) | RECTAL | 0 refills | Status: AC | PRN

## 2018-02-11 MED ORDER — HYDROMORPHONE HCL 1 MG/ML IJ SOLN
0.5000 mg | INTRAMUSCULAR | Status: DC | PRN
  Administered 2018-02-11: 0.5 mg via INTRAVENOUS

## 2018-02-11 MED ORDER — GLYCOPYRROLATE 0.2 MG/ML IJ SOLN: 0.2000 mg | INTRAMUSCULAR | Status: DC | PRN

## 2018-02-11 MED ORDER — LORAZEPAM 2 MG/ML IJ SOLN: 0.5000 mg | Freq: Four times a day (QID) | INTRAMUSCULAR | 0 refills | Status: AC | PRN

## 2018-02-11 MED ORDER — GLYCOPYRROLATE 1 MG PO TABS: 1.0000 mg | ORAL_TABLET | ORAL | Status: AC | PRN

## 2018-02-11 MED ORDER — HYDROMORPHONE HCL 1 MG/ML IJ SOLN
1.0000 mg | INTRAMUSCULAR | Status: DC
Start: 2018-02-11 — End: 2018-02-11
  Administered 2018-02-11: 1 mg via INTRAVENOUS
  Filled 2018-02-11 (×2): qty 1

## 2018-02-11 NOTE — Progress Notes (Signed)
Patient will discharge to [facility] Anticipated discharge date: 02/11/18 Family notified: Dellie Burns, daughter Transportation by: [facility]AR  Nurse to call report to [phone].   CSW signing off.  Estanislado Emms, [place]  Clinical Social Worker

## 2018-02-11 NOTE — Progress Notes (Signed)
Subjective:  Spoke with daughter patient not very interactive   Objective:  Vitals:   02/10/18 2036 02/11/18 0012 02/11/18 0500 02/11/18 0531  BP: (!) 136/101 (!) 140/115  122/90  Pulse: 92 94    Resp: (!) 22 (!) 24    Temp: 97.7 F (36.5 C) 97.6 F (36.4 C)  98.6 F (37 C)  TempSrc: Oral Axillary  Oral  SpO2: 98% 95%  100%  Weight:   187 lb 13.3 oz (85.2 kg)   Height:        Intake/Output from previous day:  Intake/Output Summary (Last 24 hours) at 02/11/2018 0768 Last data filed at 02/11/2018 0500 Gross per 24 hour  Intake 131.75 ml  Output 200 ml  Net -68.25 ml    Physical Exam: Parkinson's Some delerium  Chronically ill white male  HEENT: normal Neck supple with no adenopathy JVP normal no bruits no thyromegaly Lungs clear with no wheezing and good diaphragmatic motion Heart:  S1/S2 no murmur, no rub, gallop or click PMI normal Abdomen: benighn, BS positve, no tenderness, no AAA no bruit.  No HSM or HJR Distal pulses intact with no bruits No edema Neuro non-focal Skin warm and dry No muscular weakness   Lab Results: Basic Metabolic Panel: Recent Labs    02/10/18 0532 02/11/18 0120  NA 138 138  K 3.9 4.3  CL 101 102  CO2 30 25  GLUCOSE 120* 138*  BUN 16 19  CREATININE 1.20 1.33*  CALCIUM 9.1 9.2   Liver Function Tests: Recent Labs    02/10/18 0532  AST 28  ALT 7  ALKPHOS 52  BILITOT 0.8  PROT 5.9*  ALBUMIN 3.5   No results for input(s): LIPASE, AMYLASE in the last 72 hours. CBC: Recent Labs    02/10/18 0532 02/11/18 0120  WBC 8.7 12.8*  NEUTROABS 5.2  --   HGB 12.4* 13.5  HCT 38.3* 41.3  MCV 96.0 95.4  PLT 166 192   Cardiac Enzymes: Recent Labs    02/10/18 1213 02/10/18 2022 02/11/18 0120  TROPONINI 36.09* 60.81* 44.18*    Imaging: Dg Chest 2 View  Result Date: 02/10/2018 CLINICAL DATA:  Chest pain EXAM: CHEST - 2 VIEW COMPARISON:  07/12/2017 FINDINGS: Left chest wall 2 lead pacemaker is unchanged. Mild  cardiomegaly. Small left pleural effusion with associated atelectasis. No pulmonary edema. IMPRESSION: No acute airspace disease.  Small left pleural effusion suspected. Electronically Signed   By: Ulyses Jarred M.D.   On: 02/10/2018 05:47    Cardiac Studies:  ECG: Paced    Telemetry: Paced with PVC;s   Echo: pending   Medications:   . carbidopa-levodopa  1 tablet Oral QHS  . carbidopa-levodopa  2 tablet Oral BID WC  . cholecalciferol  1,000 Units Oral Daily  . entacapone  200 mg Oral TID  . ferrous sulfate  650 mg Oral Q breakfast  . fludrocortisone  0.1 mg Oral Q breakfast  . FLUoxetine  10 mg Oral QHS  . LORazepam  0.5 mg Intravenous Once  . LORazepam  0.5 mg Intravenous Once  . metoprolol tartrate  12.5 mg Oral BID  . pantoprazole  40 mg Oral Daily  . ranolazine  1,000 mg Oral BID     . heparin 1,000 Units/hr (02/10/18 2146)    Assessment/Plan:   MI:  Not clear but likely ST elevation with ECG masked by pacing Troponin 44 Hemodynamics are Stable and he appears fairly comfortable. See note Dr Tamala Julian decision not to cath  and DNR/Hospice Comfort measures only Given age, colon cancer , dementia and parkinson's not inappropriate   Will sign off   Jenkins Rouge 02/11/2018, 8:52 AM

## 2018-02-11 NOTE — Progress Notes (Addendum)
Addendum: Heparin now discontinued. Will discontinue protocol and levels.  Sloan Leiter, PharmD, BCPS, BCCCP Clinical Pharmacist Clinical phone 02/11/2018 until 3:30PM [phone]   ANTICOAGULATION CONSULT NOTE - [place] for heparin Indication: chest pain/ACS  Allergies  Allergen Reactions  . Ketoconazole Rash    Patient Measurements: Height: 6' (182.9 cm) Weight: 187 lb 13.3 oz (85.2 kg) IBW/kg (Calculated) : 77.6 Heparin Dosing Weight: 83.9kg  Vital Signs: Temp: 97.9 F (36.6 C) (07/16 0910) Temp Source: Oral (07/16 0910) BP: 131/81 (07/16 0910) Pulse Rate: 99 (07/16 0910)  Labs: Recent Labs    02/10/18 0532 02/10/18 1213 02/10/18 1626 02/10/18 2022 02/11/18 0120 02/11/18 0822  HGB 12.4*  --   --   --  13.5  --   HCT 38.3*  --   --   --  41.3  --   PLT 166  --   --   --  192  --   HEPARINUNFRC  --   --  0.58  --  0.14* 0.32  CREATININE 1.20  --   --   --  1.33*  --   TROPONINI  --  36.09*  --  60.81* 44.18*  --     Estimated Creatinine Clearance: 47.8 mL/min (A) (by C-G formula based on SCr of 1.33 mg/dL (H)).   Medical History: Past Medical History:  Diagnosis Date  . Adenocarcinoma of colon ([place]) 01/03/2017  . Alzheimer's disease 01/06/2016  . Atrial flutter ([place]) 12/18/2016  . Chest pain 05/29/2015  . Chronic kidney disease 05/31/2015  . Coronary artery disease involving native coronary artery    Overview:  Overview:  1. PCI and BMS to RCA 2011 2. PCI and DES to RCA 07/30/11  Cardiac cath 05/31/15: Cath 05/31/15 Conclusion  1. Mid RCA to Dist RCA lesion, 20% stenosed. The lesion was previously treated with a stent (unknown type). 2. Prox RCA lesion, 20% stenosed. 3. Mid RCA lesion, 20% stenosed. 4. 1st RPLB lesion, 99% stenosed. 5. Ost RPDA lesion, 60% stenosed. 6. LM lesion, 40% stenosed. 7. Ost Cx to Prox Cx lesion, 40% stenosed. 8. 2nd Mrg lesion, 60% stenosed. 9. Ost LAD to Mid LAD lesion, 30% stenosed. 10. Mid LAD to Dist LAD  lesion, 60% stenosed. 11. Ost 2nd Diag to 2nd Diag lesion, 99% stenosed. 12. Ost 1st Diag to 1st Diag lesion, 99% stenosed.  1. Unstable angina 2. The right coronary artery is diffusely calcified. There is mild disease in the proximal and mid segments. The distal stent is patent. Beyond the stent the posterolateral branches are small in caliber and diffusely diseased. These vessels are too small for PCI.  3. The Circumflex terminates into an obtuse margi  . DM (diabetes mellitus) ([place]) 05/31/2015  . Fall 01/06/2016  . Hyperlipidemia 05/31/2015  . Hypertension   . Hypertensive kidney disease with stage 3 chronic kidney disease ([place]) 04/25/2015  . Leukocytosis 05/11/2016  . Myocardial infarction ([place]) 07/30/2014   Overview:  x2  . NSTEMI (non-ST elevated myocardial infarction) ([place]) 05/31/2015  . Orthostatic hypotension 05/11/2016  . Parkinson disease ([place]) 05/31/2015  . Paroxysmal atrial flutter ([place]) 06/11/2016  . Subarachnoid hemorrhage ([place]) 01/06/2016  . Syncope 01/09/2016  . Type 2 diabetes mellitus without complication, without long-term current use of insulin ([place]) 06/10/2016  . Unstable angina pectoris ([place]) 05/31/2015    Assessment: 76 yom presented to the ED with CP. Troponin elevated and now starting IV heparin. Baseline Hgb and platelets are WNL. He is not on anticoagulation  PTA.   Currently on IV heparin at 1000 units/hr.  Heparin level remains therapeutic on repeat after heparin resumed overnight.   Goal of Therapy:  Heparin level 0.3-0.7 units/ml Monitor platelets by anticoagulation protocol: Yes   Plan:  Continue heparin gtt at 1000 units/hr Daily heparin level and CBC  Sloan Leiter, PharmD, BCPS, BCCCP Clinical Pharmacist Clinical phone 02/11/2018 until 3:30PM - #[phone] If after 3:30pm, please refer to [facility] for unit-specific pharmacist  02/11/2018, 10:49 AM

## 2018-02-11 NOTE — Progress Notes (Signed)
  Date: 02/11/2018  Patient name: Zariah Jost  Medical record number: [id number]  Date of birth: Nov 02, 1933   I have seen and evaluated [name] and discussed their care with the Residency Team.  In brief, patient is an 84 year old male with a past medical history of non-ST elevation MI status post DES and BMS in his RCA, severe CAD, sick sinus syndrome status post biventricular pacemaker, paroxysmal A flutter, Parkinson's disease, dementia, CKD stage III and colon cancer currently on home hospice who presented to the ED with chest pain x1 day.  Patient is unable to provide history at this time. History obtained from chart and daughters are at bedside.  Patient presented to the ED with new onset chest pain which woke him up from sleep at approximately 3 AM yesterday morning.  Patient complained of associated shortness of breath and stated that the pain felt similar to his previous heart attacks.  His chest pain improved with nitroglycerin given by EMS.  Patient denied nausea, no diaphoresis.  He was evaluated by cardiology in the ED and initially started on heparin drip but was not taken for cardiac catheterization after cardiology discussed the increased risk of mortality with the procedure with the family.  Today patient appears more confused and is unable to answer questions at this time.  Daughters are at bedside and state that they would like to keep him comfortable.  We had an extensive discussion at bedside and daughters would like to discuss further options with palliative care.  PMHx, Fam Hx, and/or Soc Hx : As per resident admit note  Vitals:   02/11/18 0531 02/11/18 0910  BP: 122/90 131/81  Pulse:  99  Resp:    Temp: 98.6 F (37 C) 97.9 F (36.6 C)  SpO2: 100% 100%   General: Confused, somnolent CVS: Regular rate and rhythm, systolic murmur noted Lungs: CTA bilaterally Abdomen: Soft, nontender, nondistended, normoactive bowel sounds Extremities: No edema noted  Assessment and  Plan: I have seen and evaluated the patient as outlined above. I agree with the formulated Assessment and Plan as detailed in the residents' note, with the following changes:   1.  Acute myocardial infarction: -Patient presented to the ED with new onset chest pain similar to his previous heart attacks and was noted to have elevated troponins peaking at 60.  He likely had an ST elevation MI but no EKG changes could be noted as he is V paced. -Cardiology had an extensive discussion with the family yesterday and they decided not to proceed with cardiac catheterization -Cardiology follow-up and recommendations appreciated -Patient's overall prognosis remains poor given his acute myocardial infarction as well as history of Parkinson's and recurrent colon cancer -We will DC heparin drip today -Palliative care consult called.  I suspect the patient will qualify for inpatient hospice given his worsening condition -We will continue with Ativan IV as well as scheduled Dilaudid IV -We will hold off on oral medications as patient is unable to take them for now -No further work-up at this time  Aldine Contes, MD 7/16/20191:19 PM

## 2018-02-11 NOTE — Discharge Summary (Signed)
Name: Kenneth Macias MRN: [id number] DOB: [birth year] 84 y.o. PCP: Charletta Cousin, MD (Inactive)  Date of Admission: 02/10/2018  4:12 AM Date of Discharge: 02/11/18 Attending Physician: Aldine Contes, MD  Discharge Diagnosis: 1. Acute Myocardial Infarction 2. Colon Cancer  Discharge Medications: Allergies as of 02/11/2018      Reactions   Ketoconazole Rash      Medication List    STOP taking these medications   cholecalciferol 1000 units tablet Commonly known as:  VITAMIN D   FLUoxetine 10 MG tablet Commonly known as:  PROZAC   Iron 325 (65 Fe) MG Tabs   LORazepam 0.5 MG tablet Commonly known as:  ATIVAN Replaced by:  LORazepam 2 MG/ML injection   pantoprazole 40 MG tablet Commonly known as:  PROTONIX     TAKE these medications   acetaminophen 650 MG suppository Commonly known as:  TYLENOL Place 1 suppository (650 mg total) rectally every 6 (six) hours as needed for mild pain (or Fever >/= 101).   AMBULATORY NON FORMULARY MEDICATION Manual Wheelchair 16x18 full length removable arm rest Removable elevated leg rest 2 inch cusion  DX: G20   Carbidopa-Levodopa ER 61.25-245 MG Cpcr Commonly known as:  RYTARY Take 1 tablet by mouth See admin instructions. TAKES 2 TABLETS IN THE AM, 2 TABLET AT NOON, 1 TABLET IN THE EVENING   entacapone 200 MG tablet Commonly known as:  COMTAN Take 1 tablet (200 mg total) by mouth 3 (three) times daily.   fludrocortisone 0.1 MG tablet Commonly known as:  FLORINEF Take 1 tablet (0.1 mg total) by mouth daily.   glycopyrrolate 1 MG tablet Commonly known as:  ROBINUL Take 1 tablet (1 mg total) by mouth every 4 (four) hours as needed (excessive secretions).   glycopyrrolate 0.2 MG/ML injection Commonly known as:  ROBINUL Inject 1 mL (0.2 mg total) into the skin every 4 (four) hours as needed (excessive secretions).   glycopyrrolate 0.2 MG/ML injection Commonly known as:  ROBINUL Inject 1 mL (0.2 mg total) into the vein every  4 (four) hours as needed (excessive secretions).   HYDROmorphone 1 MG/ML injection Commonly known as:  DILAUDID Inject 0.5 mLs (0.5 mg total) into the vein every 30 (thirty) minutes as needed for moderate pain (Dyspnea, respirations >25, distress).   HYDROmorphone 1 MG/ML injection Commonly known as:  DILAUDID Inject 1 mL (1 mg total) into the vein every 4 (four) hours.   isosorbide mononitrate 60 MG 24 hr tablet Commonly known as:  IMDUR Take 30 mg by mouth daily.   LORazepam 2 MG/ML injection Commonly known as:  ATIVAN Inject 0.25 mLs (0.5 mg total) into the vein every 6 (six) hours as needed for anxiety. Replaces:  LORazepam 0.5 MG tablet   LORazepam 2 MG/ML injection Commonly known as:  ATIVAN Inject 0.5 mLs (1 mg total) into the vein every 4 (four) hours.   ranolazine 1000 MG SR tablet Commonly known as:  RANEXA Take 1 tablet (1,000 mg total) by mouth 2 (two) times daily.      Admit to inpatient hospice for symptoms management of pain, agitation, dyspnea.- les than 6 month prognosis. Disposition and follow-up:   [name] was discharged [facility] in [place] condition.  At the hospital follow up visit please address:  1. None - pt discharged to [facility].  2.  Labs / imaging needed at time of follow-up: NA  3.  Pending labs/ test needing follow-up: NA  Follow-up Appointments: [facility]  Course by problem list: 1. Acute Myocardial Infarction  Pt with hx of multiple MIs, and extensive CAD presented with morning chest pain, pressure, and SOB that was partially relieved with nitroglycerin in the ambulance. Troponin positive, peaked at 60, EKG was uninterruptable d/t bi-ventricular pacemaker. After explaining the risks of cardiac cath to the family, considering pts comorbidities and poor prognosis, pt did not undergo catheterization and was initially managed with heparin drip, morphine, and metoprolol. After discussions with  palliative care, family decided to move towards comfort care and continue care at [facility]. Considering all comorbidities, pt's mortality prognosis is less than 6 months.  2. Colon Cancer Pt diagnosed with adenocarcinoma of the colon in 2018 s/p colectomy. Pt has since made his wishes known to his family that he does not want to pursue any further diagnostic work up or treatment. He was placed on Hospice   Discharge Vitals:   BP 132/83 (BP Location: Left Arm)   Pulse 70   Temp 98.5 F (36.9 C) (Axillary)   Resp (!) 24   Ht 6' (1.829 m)   Wt 187 lb 13.3 oz (85.2 kg)   SpO2 100%   BMI 25.47 kg/m   Pertinent Labs, Studies, and Procedures:  Troponin 1.29 --> 36 --> 60 --> 44  Discharge Instructions: Discharge Instructions    Diet - low sodium heart healthy   Complete by:  As directed    Increase activity slowly   Complete by:  As directed     Pt is to be taken care of at [facility] for comfort care measures.  SignedLorella Nimrod, MD 02/11/2018, 3:18 PM   Pager: [phone]

## 2018-02-11 NOTE — Progress Notes (Signed)
   Subjective: Pt appears agitated and is not communicating verbally this morning. Daughters at bedside wish for pt to remain comfortable but do not yet appear to have total insight into the patient's poor prognosis regarding this massive MI, colon cancer, dementia, and advanced Parkinsons.  Objective:  Vital signs in last 24 hours: Vitals:   02/11/18 0012 02/11/18 0500 02/11/18 0531 02/11/18 0910  BP: (!) 140/115  122/90 131/81  Pulse: 94   99  Resp: (!) 24     Temp: 97.6 F (36.4 C)  98.6 F (37 C) 97.9 F (36.6 C)  TempSrc: Axillary  Oral Oral  SpO2: 95%  100% 100%  Weight:  85.2 kg (187 lb 13.3 oz)    Height:       Gen: pt restless in bed, eyes are closed, grimacing Cardio: RRR, no murmur Resp: mild increased WOB, LCAB Abd: soft, non-tender, +BS Ext: warm, no pitting edema   Assessment/Plan: Kenneth Macias is an 84 year old male with PMHx significant for 3 reported previous NTEMIs s/p DES and BMS both in the RCA, severe CAD on last cath in 2016, Sick Sinus syndrome s/p bi-ventricular pacemaker, paroxysmal Aflutter, Parkinsons, dementia, hx subarachnoid hemorrhage, CKD III, and colon cancer for which, he is receiving hospice care at home presenting with left sided chest pain, pressure, and SOB with troponin elevation peaked at 60, significant for NSTEMI vs STEMI. Pt is under home hospice care, and considering massive infarction, cardiology informed pts family of the risk associated with cardiac cath. Pt has been medically managed for MI and will not undergo invasive procedures. After discussions with palliative care this morning, pt will be transitioned to full comfort care and transferred to [facility].  #NTEMI  -troponin peak at 60 -s/p heparin gtt -s/p nitroglycerin -s/p morphine Plan: -we will stop medical management of MI in order to move forward with full comfort care -pt to be discharged for [facility]  #Parkinsons ~10 year  hx Plan: -DC Rytary, entacapone -DC Prozac -IV Ativan PRN anxiety  #Autonomic Instability -associated with Parkinsons Plan: -continue fludrocortisone   #Paroxysmal Aflutter -DC telemetry  #T2DM -no meds -DC daily BG checks -carb modified diet  #DVT Ppx:  heparin  #Dispo: Inpatient with expected LOS <24 hrs.    Kenneth Macias, Kenneth Macias, Medical Student 02/11/2018, 1:38 PM Pager [phone]

## 2018-02-11 NOTE — Progress Notes (Addendum)
PT Cancellation Note  Patient Details Name: Kenneth Macias MRN: [id number] DOB: [birth year]   Cancelled Treatment:    Reason Eval/Treat Not Completed: Patient not medically ready(Pt not appropriate for therapy, comfort measures per nurse.  Sign off.  )   Denice Paradise 02/11/2018, 11:13 AM [facility] [phone] (pager)

## 2018-02-11 NOTE — Progress Notes (Signed)
Report given to Nurse Jenny Reichmann a hospice house of [place]. All questions were answered & a call back number was given in case she had any questions. Hoover Brunette, RN

## 2018-02-11 NOTE — Clinical Social Work Note (Signed)
Clinical Social Work Assessment  Patient Details  Name: Kenneth Macias MRN: [id number] Date of Birth: April 12, 1934  Date of referral:  02/11/18               Reason for consult:  End of Life/Hospice                Permission sought to share information with:  Facility Sport and exercise psychologist, Family Supports Permission granted to share information::  No(patient not oriented)  Name::     Community education officer  Agency::  [facility]  Relationship::  daughter  Contact Information:  [phone]  Housing/Transportation Living arrangements for the past 2 months:  Single Family Home Source of Information:  Adult Children, Medical Team Patient Interpreter Needed:  None Criminal Activity/Legal Involvement Pertinent to Current Situation/Hospitalization:  No - Comment as needed Significant Relationships:  Adult Children Lives with:  Adult Children Do you feel safe going back to the place where you live?  Yes Need for family participation in patient care:  Yes (Comment)  Care giving concerns: Patient from home with daughter, had been followed by [facility] at home. Patient now DNR and full comfort care.   Social Worker assessment / plan: CSW consulted for residential hospice placement. CSW spoke to patient's daughter, Kenneth Macias, and verified that family prefers [facility] in [place]. CSW sent referral and spoke to admissions at hospice. They have a bed for patient and are familiar with him as they have followed him at home. Paged MD. CSW to follow and support with discharge.  Employment status:  Retired Forensic scientist:  Managed Medicare(BCBS) PT Recommendations:  Not assessed at this time Information / Referral to community resources:  Other (Comment Required)(residential hospice)  Patient/Family's Response to care: Family appreciative of care.  Patient/Family's Understanding of and Emotional Response to Diagnosis, Current Treatment, and Prognosis: Family with understanding of  patient's prognosis and agreeable to residential hospice.  Emotional Assessment Appearance:  Appears stated age Attitude/Demeanor/Rapport:  Unable to Assess Affect (typically observed):  Unable to Assess Orientation:  Oriented to Self Alcohol / Substance use:  Not Applicable Psych involvement (Current and /or in the community):  No (Comment)  Discharge Needs  Concerns to be addressed:  Grief and Loss Concerns, Other (Comment Required(end of life care) Readmission within the last 30 days:  No Current discharge risk:  Terminally ill Barriers to Discharge:  No Barriers Identified   Kenneth Emms, LCSW 02/11/2018, 2:38 PM

## 2018-02-11 NOTE — Consult Note (Signed)
Consultation Note Date: 02/11/2018   Patient Name: Kenneth Macias  DOB: Jul 21, 1934  MRN: [id number]  Age / Sex: 84 y.o., male  PCP: Charletta Cousin, MD (Inactive) Referring Physician: Aldine Contes, MD  Reason for Consultation: Disposition, Establishing goals of care and Terminal Care  HPI/Patient Profile: This is an 84 year old gentleman from [place] who has severe end-stage coronary artery disease status post myocardial infarction this admission also has a diagnosis of recurrent colon cancer with a rising CEA and multiple other chronic diseases including a long history of progressive Parkinson's disease.  He has been living at his youngest daughter's home and was on hospice services with [facility] prior to admission but there was miscommunication about the severity of his CAD and family decided that they wanted to pursue options to treat his heart, however when he arrived and cardiology saw him and spoke with family they are all now in agreement that his CAD and his overall health are terminal and there is really at this point no reasonable medical options to reverse or treat his disease. They have all agreed on a comfort care approach.   Clinical Assessment and Goals of Care:  1. Given far advanced terminal CAD, along with recurrent colon ca and progressive parkinsons he is approaching EOL.  Goals are full comfort.  2.  At this time he is unresponsive, have recommended placement at [place] I believe that he will need very close monitoring of his symptoms especially since he is unable to take his oral Parkinson's meds.  3.  He has been having ongoing continuing severe chest pain and I have recommended that we increase his opioid he has mild acute kidney injury/renal failure therefore I believe that we should use hydromorphone to reduce the toxicity associated with  morphine.  4.  Ativan for spasticity prior to this admission therefore I have resumed his Ativan as scheduled every 4 hours increments as needed agitation.   Patient has a son, three daughters and multiple grandchildren involved in his care- all are in agreement and understand comfort approach.    SUMMARY OF RECOMMENDATIONS   1. Stop oral meds, comfort feeding/oral care as tolerated only 2. Stop all meds not related to comfort 3. Aggressive symptom management 4. Transfer to [facility] ASAP for terminal care, EOL.   Code Status/Advance Care Planning:  DNR  Symptom Management:   Hydromorphone q30 min PRN and scheduled q4 hours- he may need a continuous infusion  Ativan 1mg  q4 and q2 PRN  Oral care, Robinul for secretions, turn q 2  Palliative Prophylaxis:   Aspiration, Delirium Protocol, Frequent Pain Assessment, Oral Care and Turn Reposition  Additional Recommendations (Limitations, Scope, Preferences):  Full Comfort Care  Psycho-social/Spiritual:   Desire for further Chaplaincy support:yes  Additional Recommendations: Caregiving  Support/Resources, Education on Hospice and Grief/Bereavement Support  Prognosis:   Hours - Days  Discharge Planning: Hospice facility      Primary Diagnoses: Present on Admission: . Chest pain . Unstable angina pectoris ([place]) . Coronary  artery disease . Parkinson disease ([place]) . Hyperlipidemia . Essential hypertension . Chronic kidney disease . Paroxysmal atrial flutter ([place]) . Alzheimer's disease . Adenocarcinoma of colon ([place]) . Hypertensive kidney disease with stage 3 chronic kidney disease ([place]) . Sick sinus syndrome ([place])   I have reviewed the medical record, interviewed the patient and family, and examined the patient. The following aspects are pertinent.  Past Medical History:  Diagnosis Date  . Adenocarcinoma of colon ([place]) 01/03/2017  . Alzheimer's disease 01/06/2016  . Atrial flutter ([place]) 12/18/2016   . Chest pain 05/29/2015  . Chronic kidney disease 05/31/2015  . Coronary artery disease involving native coronary artery    Overview:  Overview:  1. PCI and BMS to RCA 2011 2. PCI and DES to RCA 07/30/11  Cardiac cath 05/31/15: Cath 05/31/15 Conclusion  1. Mid RCA to Dist RCA lesion, 20% stenosed. The lesion was previously treated with a stent (unknown type). 2. Prox RCA lesion, 20% stenosed. 3. Mid RCA lesion, 20% stenosed. 4. 1st RPLB lesion, 99% stenosed. 5. Ost RPDA lesion, 60% stenosed. 6. LM lesion, 40% stenosed. 7. Ost Cx to Prox Cx lesion, 40% stenosed. 8. 2nd Mrg lesion, 60% stenosed. 9. Ost LAD to Mid LAD lesion, 30% stenosed. 10. Mid LAD to Dist LAD lesion, 60% stenosed. 11. Ost 2nd Diag to 2nd Diag lesion, 99% stenosed. 12. Ost 1st Diag to 1st Diag lesion, 99% stenosed.  1. Unstable angina 2. The right coronary artery is diffusely calcified. There is mild disease in the proximal and mid segments. The distal stent is patent. Beyond the stent the posterolateral branches are small in caliber and diffusely diseased. These vessels are too small for PCI.  3. The Circumflex terminates into an obtuse margi  . DM (diabetes mellitus) ([place]) 05/31/2015  . Fall 01/06/2016  . Hyperlipidemia 05/31/2015  . Hypertension   . Hypertensive kidney disease with stage 3 chronic kidney disease ([place]) 04/25/2015  . Leukocytosis 05/11/2016  . Myocardial infarction ([place]) 07/30/2014   Overview:  x2  . NSTEMI (non-ST elevated myocardial infarction) ([place]) 05/31/2015  . Orthostatic hypotension 05/11/2016  . Parkinson disease ([place]) 05/31/2015  . Paroxysmal atrial flutter ([place]) 06/11/2016  . Subarachnoid hemorrhage ([place]) 01/06/2016  . Syncope 01/09/2016  . Type 2 diabetes mellitus without complication, without long-term current use of insulin ([place]) 06/10/2016  . Unstable angina pectoris ([place]) 05/31/2015   Social History   Socioeconomic History  . Marital status: Widowed    Spouse name: Not on file  . Number of children: 4   . Years of education: 49  . Highest education level: Not on file  Occupational History  . 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 Financial resource strain: Not on file  . Food insecurity:    Worry: Not on file    Inability: Not on file  . Transportation needs:    Medical: Not on file    Non-medical: Not on file  Tobacco Use  . Smoking status: Never Smoker  . Smokeless tobacco: Current User    Types: Chew  Substance and Sexual Activity  . Alcohol use: No  . Drug use: No  . Sexual activity: Not on file  Lifestyle  . Physical activity:    Days per week: Not on file    Minutes per session: Not on file  . Stress: Not on file  Relationships  . Social connections:    Talks on phone: Not on file  Gets together: Not on file    Attends religious service: Not on file    Active member of club or organization: Not on file    Attends meetings of clubs or organizations: Not on file    Relationship status: Not on file  Other Topics Concern  . Not on file  Social History Narrative   Patient lives with one of his daughters in a one story home.  Has 4 children.  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  Education: high school.    Family History  Problem Relation Age of Onset  . Cancer Mother   . Colon cancer Mother   . Heart attack Father   . Heart disease Father   . Diabetes Father   . CAD Son   . Colon cancer Son   . CAD Daughter   . Heart disease Daughter    Scheduled Meds: . carbidopa-levodopa  1 tablet Oral QHS  . carbidopa-levodopa  2 tablet Oral BID WC  . cholecalciferol  1,000 Units Oral Daily  . entacapone  200 mg Oral TID  . ferrous sulfate  650 mg Oral Q breakfast  . fludrocortisone  0.1 mg Oral Q breakfast  . FLUoxetine  10 mg Oral QHS  . LORazepam  0.5 mg Intravenous Once  . LORazepam  0.5 mg Intravenous Once  . metoprolol tartrate  12.5 mg Oral BID  . pantoprazole  40 mg Oral Daily  . ranolazine   1,000 mg Oral BID   Continuous Infusions: PRN Meds:.acetaminophen **OR** acetaminophen, LORazepam, morphine injection Medications Prior to Admission:  Prior to Admission medications   Medication Sig Start Date End Date Taking? Authorizing Provider  Carbidopa-Levodopa ER (RYTARY) 61.25-245 MG CPCR Take 1 tablet by mouth See admin instructions. TAKES 2 TABLETS IN THE AM, 2 TABLET AT NOON, 1 TABLET IN THE EVENING 12/30/17  Yes Tat, Eustace Quail, DO  cholecalciferol (VITAMIN D) 1000 UNITS tablet Take 1,000 Units by mouth daily.   Yes [provider]  entacapone (COMTAN) 200 MG tablet Take 1 tablet (200 mg total) by mouth 3 (three) times daily. 12/30/17  Yes Tat, Eustace Quail, DO  Ferrous Sulfate (IRON) 325 (65 Fe) MG TABS Take 2 tablets by mouth daily.   Yes [provider]  fludrocortisone (FLORINEF) 0.1 MG tablet Take 1 tablet (0.1 mg total) by mouth daily. 09/05/17  Yes Richardo Priest, MD  FLUoxetine (PROZAC) 10 MG tablet Take 10 mg by mouth at bedtime.    Yes [provider]  isosorbide mononitrate (IMDUR) 60 MG 24 hr tablet Take 30 mg by mouth daily.   Yes [provider]  LORazepam (ATIVAN) 0.5 MG tablet Take 0.5 mg by mouth every 6 (six) hours as needed for anxiety.   Yes [provider]  pantoprazole (PROTONIX) 40 MG tablet Take 40 mg by mouth daily.   Yes [provider]  ranolazine (RANEXA) 1000 MG SR tablet Take 1 tablet (1,000 mg total) by mouth 2 (two) times daily. 06/24/17 06/24/18 Yes Richardo Priest, MD  AMBULATORY NON FORMULARY MEDICATION Manual Wheelchair [phone] full length removable arm rest Removable elevated leg rest 2 inch cusion  DX: G20 12/26/17   Tat, Eustace Quail, DO   Allergies  Allergen Reactions  . Ketoconazole Rash   Review of Systems  Physical Exam  Vital Signs: BP 131/81 (BP Location: Left Arm)   Pulse 99   Temp 97.9 F (36.6 C) (Oral)   Resp (!) 24   Ht 6' (1.829  m)   Wt 85.2 kg (187 lb 13.3 oz)   SpO2 100%    BMI 25.47 kg/m  Pain Scale: 0-10   Pain Score: 1    SpO2: SpO2: 100 % O2 Device:SpO2: 100 % O2 Flow Rate: .O2 Flow Rate (L/min): 2 L/min  IO: Intake/output summary:   Intake/Output Summary (Last 24 hours) at 02/11/2018 1302 Last data filed at 02/11/2018 0500 Gross per 24 hour  Intake 131.75 ml  Output 200 ml  Net -68.25 ml    LBM: Last BM Date: 02/11/18 Baseline Weight: Weight: 83.9 kg (185 lb) Most recent weight: Weight: 85.2 kg (187 lb 13.3 oz)     Palliative Assessment/Data: 20%      Time Total: 70 min Greater than 50%  of this time was spent counseling and coordinating care related to the above assessment and plan.  Signed by: Lane Hacker, DO   Please contact Palliative Medicine Team phone at [phone] for questions and concerns.  For individual provider: See Shea Evans

## 2018-02-11 NOTE — Care Management Note (Signed)
Case Management Note  Patient Details  Name: Kerry Chisolm MRN: [id number] Date of Birth: June 08, 1934  Subjective/Objective: Pt presented for Unstable Angina. PTA from home with [place] with [facility]. Palliative Consulted and plan will be for Residential Hospice.                    Action/Plan: CSW assisting with Residential Hospice. No further needs from CM at this time.   Expected Discharge Date:  02/11/18               Expected Discharge Plan:  [place]  In-House Referral:  Clinical Social Work  Discharge planning Services  CM Consult  Post Acute Care Choice:  NA Choice offered to:  NA  DME Arranged:  N/A DME Agency:  NA  HH Arranged:  NA HH Agency:  NA  Status of Service:  Completed, signed off  If discussed at [employer] of Stay Meetings, dates discussed:    Additional Comments:  Bethena Roys, RN 02/11/2018, 4:03 PM

## 2018-02-19 ENCOUNTER — Encounter: Payer: Medicare Other | Admitting: *Deleted

## 2018-02-19 ENCOUNTER — Telehealth: Payer: Self-pay | Admitting: Cardiology

## 2018-02-19 NOTE — Telephone Encounter (Signed)
LMOVM reminding pt to send remote transmission.   

## 2018-02-20 ENCOUNTER — Encounter: Payer: Self-pay | Admitting: Cardiology

## 2018-02-24 ENCOUNTER — Telehealth: Payer: Self-pay | Admitting: Cardiology

## 2018-02-26 NOTE — Telephone Encounter (Signed)
error 

## 2018-02-27 DEATH — deceased

## 2018-02-28 ENCOUNTER — Telehealth: Payer: Self-pay | Admitting: Neurology

## 2018-02-28 NOTE — Telephone Encounter (Signed)
-----   Message from [place], DO sent at 02/28/2018  8:53 AM EDT ----- Regarding: RE: Deceased Tell pts daughter I'm sorry to hear of his death.  Let her know that we can't take carbidopa/levodopa back but thanks for offer.  She can crush it and mix it with coffee grounds or kitty litter and then discard after that. ----- Message ----- From: Elenora Fender Sent: 02/24/2018   1:33 PM To: Eustace Quail Tat, DO Subject: Deceased                                       Hi Dr. Carles Collet  Patient's daughter called to let you know that he has passed away. She was wanting to know if you wanted what was left of his Carbidopa Levodopa medication. Please Advise.  Thanks [employer]

## 2018-02-28 NOTE — Telephone Encounter (Signed)
Patients daughter made aware

## 2018-04-20 IMAGING — DX DG CHEST 2V
2 series · 2 of 2 positions shown · non-contrast
Comparison: None.

CLINICAL DATA: Pacemaker failure/malfunction.

EXAM:
CHEST  2 VIEW

[chest pa]
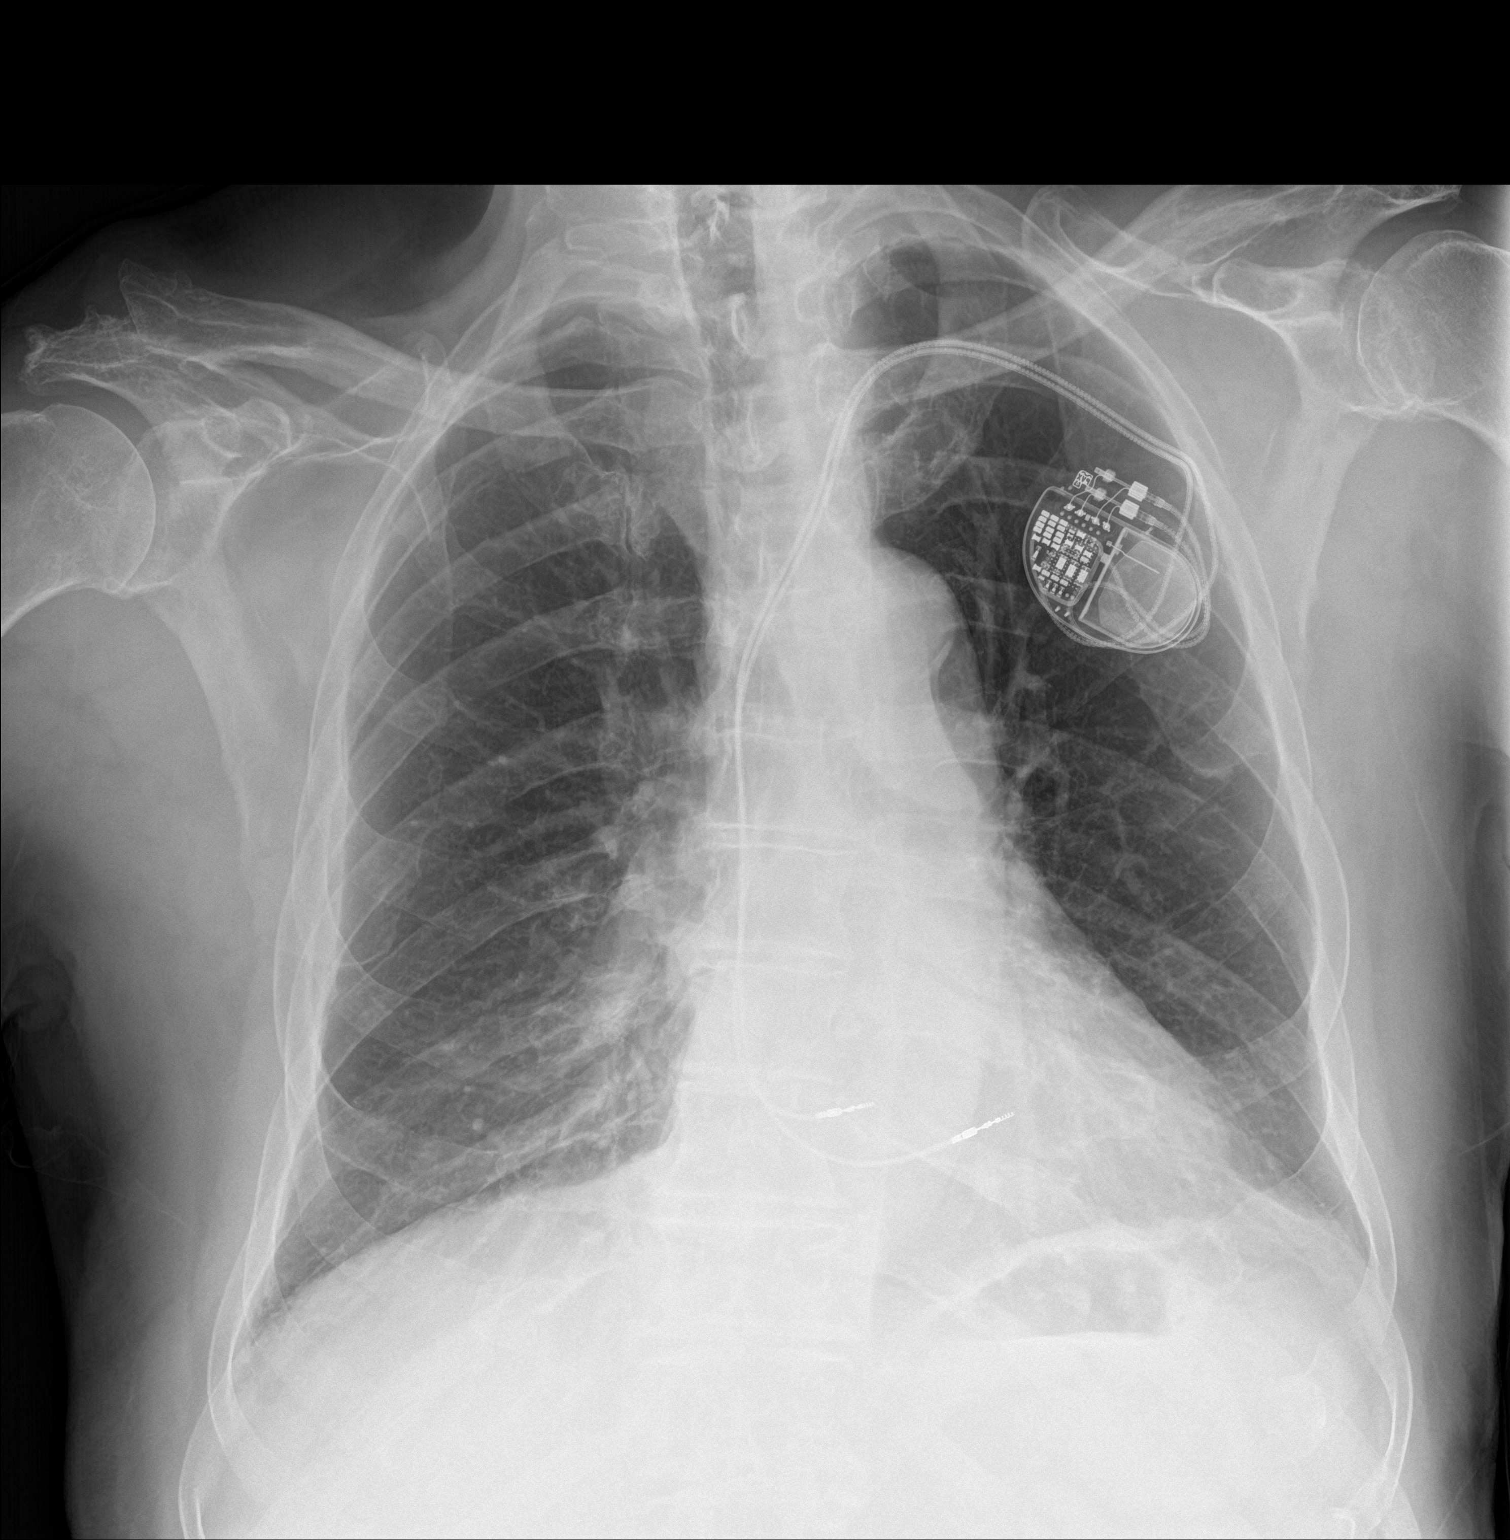

[chest lat]
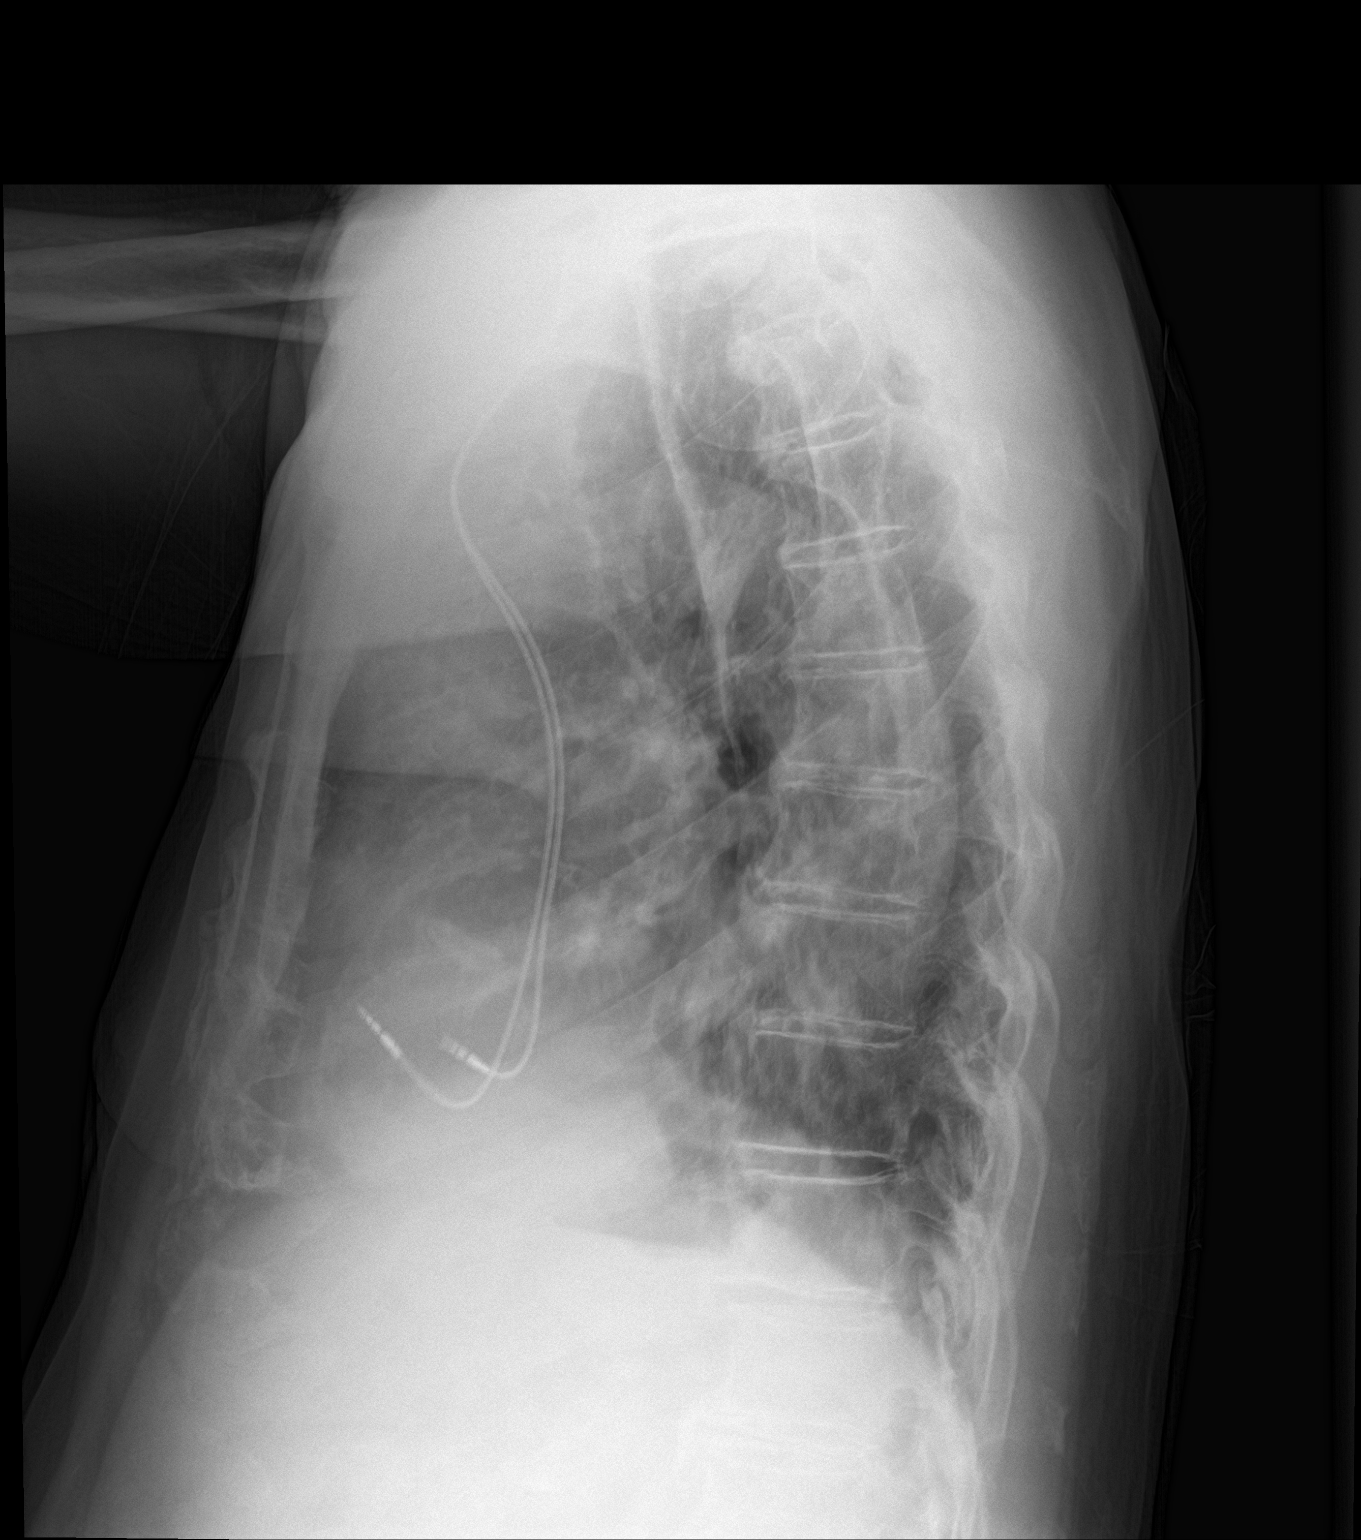

[2 of 2 positions shown; findings below may reference images not displayed]

FINDINGS: Motion degraded lateral view. Dual lead pacer. Both leads project
over the right ventricle. No lead discontinuity. Midline trachea.
Mild cardiomegaly. Transverse aortic atherosclerosis. No pleural
effusion or pneumothorax. No congestive failure. Left apical pleural
thickening. Mild bibasilar atelectasis or scar.
IMPRESSION: No acute cardiopulmonary disease. Dual lead pacer with both leads
projecting over the right ventricle.

Aortic Atherosclerosis (D1ZIC-RZ2.2).

## 2018-06-10 ENCOUNTER — Ambulatory Visit: Payer: Medicare Other | Admitting: Neurology

## 2018-12-12 IMAGING — CR DG CHEST 2V
2 series · 2 of 2 positions shown · non-contrast
Comparison: 07/12/2017

CLINICAL DATA: Chest pain

EXAM:
CHEST - 2 VIEW

[chest lat]
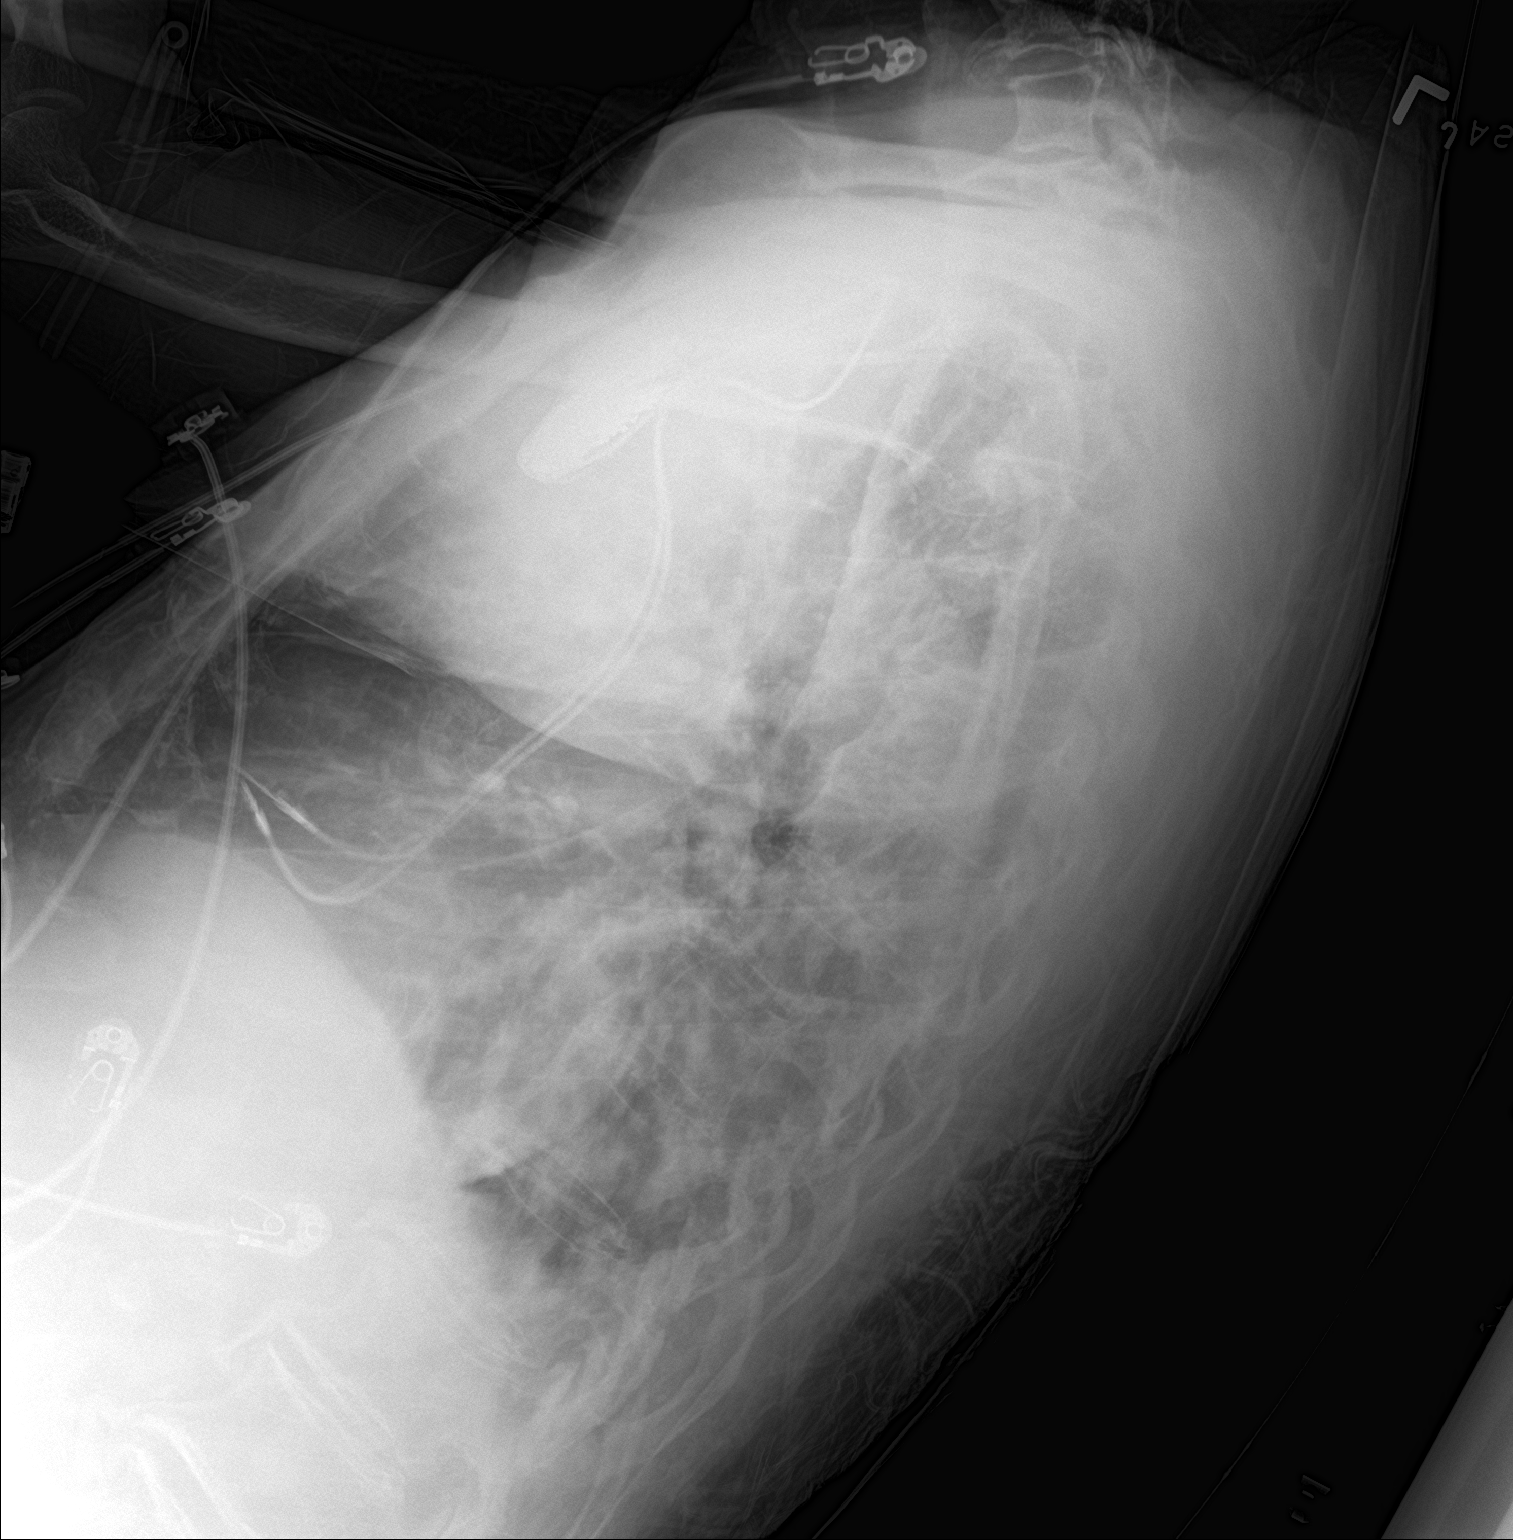

[chest ap]
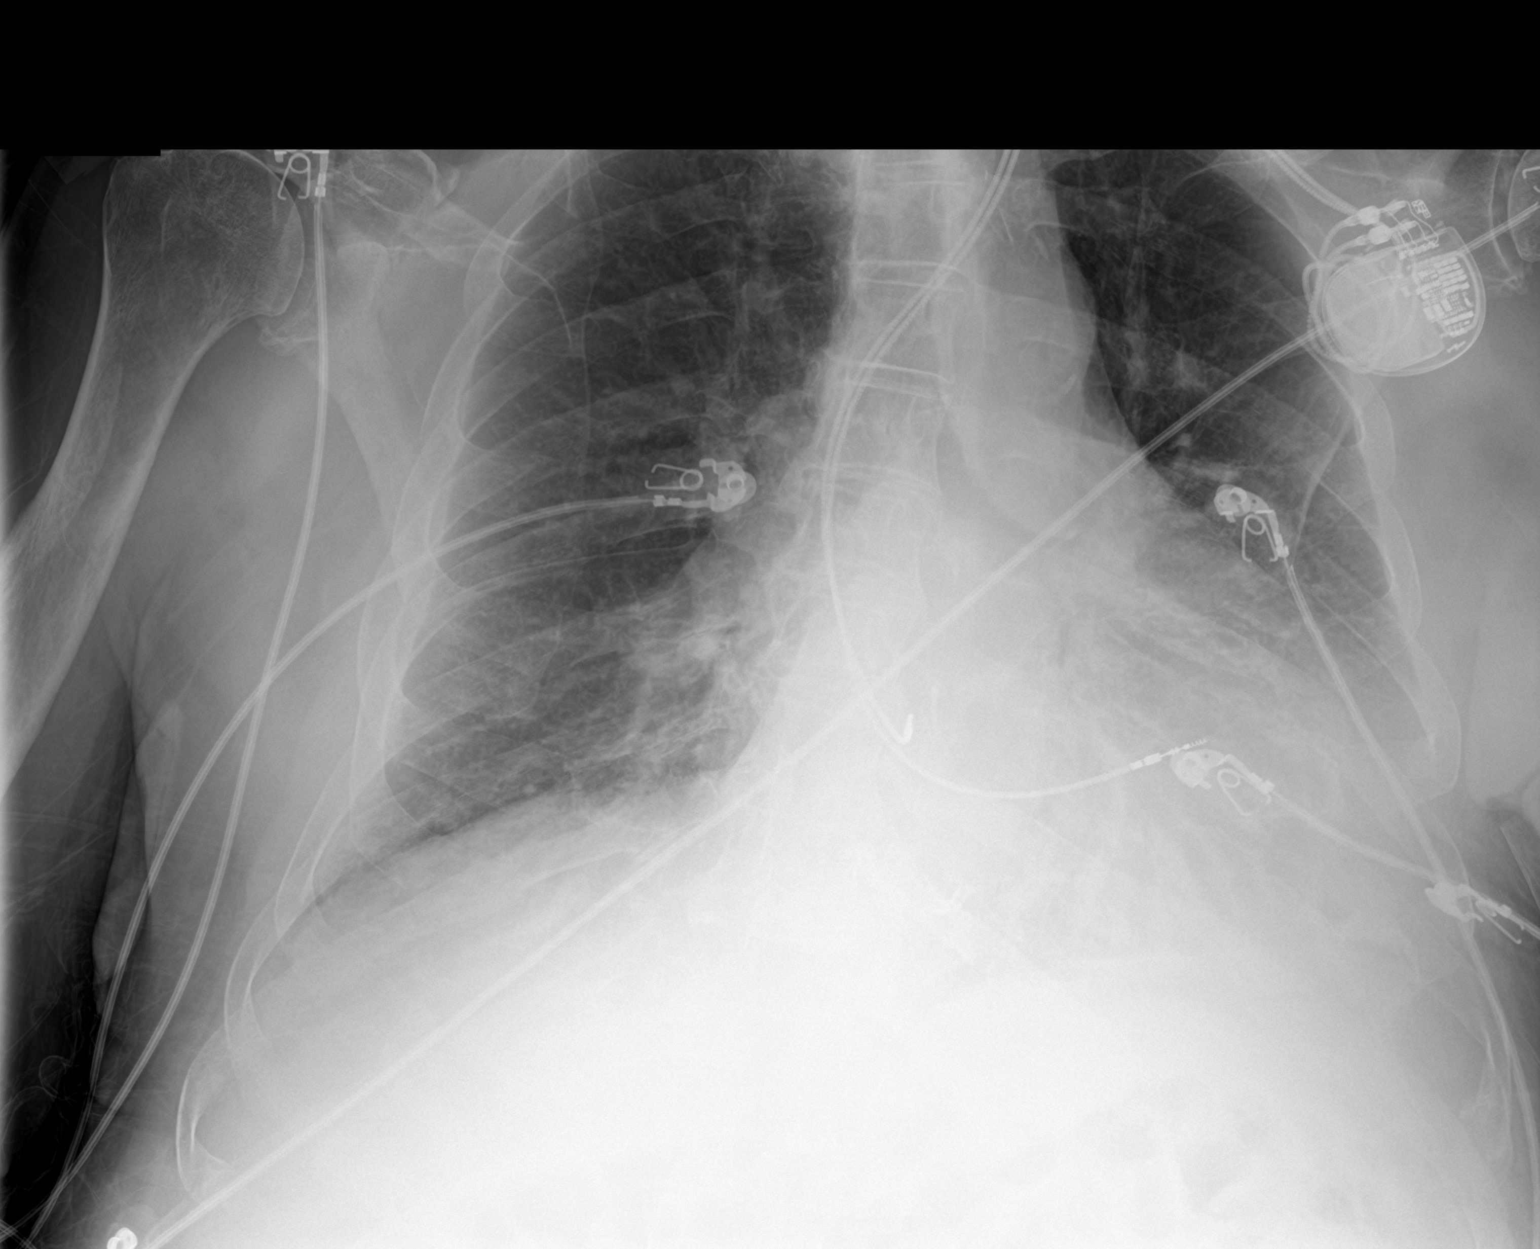

[2 of 2 positions shown; findings below may reference images not displayed]

FINDINGS: Left chest wall 2 lead pacemaker is unchanged. Mild cardiomegaly.
Small left pleural effusion with associated atelectasis. No
pulmonary edema.
IMPRESSION: No acute airspace disease.  Small left pleural effusion suspected.
